# Patient Record
Sex: Male | Born: 1995 | Race: White | Hispanic: Yes | Marital: Single | Smoking: Never smoker
Health system: Southern US, Community
[De-identification: ages and names within clinical notes are randomized; demographics above are authoritative.]

## PROBLEM LIST (undated history)

## (undated) DIAGNOSIS — J45909 Unspecified asthma, uncomplicated: Secondary | ICD-10-CM

## (undated) DIAGNOSIS — K5792 Diverticulitis of intestine, part unspecified, without perforation or abscess without bleeding: Secondary | ICD-10-CM

## (undated) HISTORY — PX: TONSILLECTOMY: SUR1361

---
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
## 2017-05-26 ENCOUNTER — Emergency Department (HOSPITAL_COMMUNITY)
Admission: EM | Admit: 2017-05-26 | Discharge: 2017-05-26 | Disposition: A | Discharge status: Home / Self Care | Payer: Self-pay | Attending: Emergency Medicine | Admitting: Emergency Medicine

## 2017-05-26 ENCOUNTER — Other Ambulatory Visit: Payer: Self-pay

## 2017-05-26 ENCOUNTER — Emergency Department (HOSPITAL_COMMUNITY): Payer: Self-pay

## 2017-05-26 ENCOUNTER — Encounter (HOSPITAL_COMMUNITY): Payer: Self-pay

## 2017-05-26 DIAGNOSIS — R1032 Left lower quadrant pain: Secondary | ICD-10-CM | POA: Insufficient documentation

## 2017-05-26 LAB — URINALYSIS, ROUTINE W REFLEX MICROSCOPIC
GLUCOSE, UA: NEGATIVE mg/dL
HGB URINE DIPSTICK: NEGATIVE
KETONES UR: 20 mg/dL — AB
Leukocytes, UA: NEGATIVE
NITRITE: NEGATIVE
PH: 5 (ref 5.0–8.0)
PROTEIN: 30 mg/dL — AB
RBC / HPF: NONE SEEN RBC/hpf (ref 0–5)
Specific Gravity, Urine: 1.035 — ABNORMAL HIGH (ref 1.005–1.030)

## 2017-05-26 LAB — COMPREHENSIVE METABOLIC PANEL
ALK PHOS: 53 U/L (ref 38–126)
ALT: 23 U/L (ref 17–63)
ANION GAP: 9 (ref 5–15)
AST: 18 U/L (ref 15–41)
Albumin: 3.9 g/dL (ref 3.5–5.0)
BILIRUBIN TOTAL: 1.8 mg/dL — AB (ref 0.3–1.2)
BUN: 7 mg/dL (ref 6–20)
CALCIUM: 9 mg/dL (ref 8.9–10.3)
CO2: 22 mmol/L (ref 22–32)
Chloride: 105 mmol/L (ref 101–111)
Creatinine, Ser: 0.61 mg/dL (ref 0.61–1.24)
GFR calc Af Amer: >60 mL/min (ref >60)
Glucose, Bld: 98 mg/dL (ref 65–99)
Potassium: 3.5 mmol/L (ref 3.5–5.1)
SODIUM: 136 mmol/L (ref 135–145)
TOTAL PROTEIN: 7.8 g/dL (ref 6.5–8.1)

## 2017-05-26 LAB — CBC
HCT: 49.2 % (ref 39.0–52.0)
HEMOGLOBIN: 17.1 g/dL — AB (ref 13.0–17.0)
MCH: 30.5 pg (ref 26.0–34.0)
MCHC: 34.8 g/dL (ref 30.0–36.0)
MCV: 87.9 fL (ref 78.0–100.0)
PLATELETS: 296 10*3/uL (ref 150–400)
RBC: 5.6 MIL/uL (ref 4.22–5.81)
RDW: 12.5 % (ref 11.5–15.5)
WBC: 14.6 10*3/uL — AB (ref 4.0–10.5)

## 2017-05-26 LAB — LIPASE, BLOOD: Lipase: 18 U/L (ref 11–51)

## 2017-05-26 MED ORDER — IOPAMIDOL (ISOVUE-300) INJECTION 61%
INTRAVENOUS | Status: AC
  Filled 2017-05-26: qty 100

## 2017-05-26 MED ORDER — SODIUM CHLORIDE 0.9 % IV BOLUS (SEPSIS): 1000.0000 mL | Freq: Once | INTRAVENOUS | Status: DC

## 2017-05-26 NOTE — Discharge Instructions (Signed)
Please read the attached information regarding your condition. Follow-up with your primary care provider for further evaluation. Return to ED for severe worsening abdominal pain, increased vomiting, lightheadedness, loss of consciousness.

## 2017-05-26 NOTE — ED Provider Notes (Signed)
[facility]-EMERGENCY DEPT Provider Note   CSN: [id number] Arrival date & time: 05/26/17  1338     History   Chief Complaint Chief Complaint  Patient presents with  . Abdominal Pain    HPI ARISTON GRANDISON is a 21 y.o. male  with no significant past medical history, who presents to ED for evaluation of left flank and left lower quadrant abdominal pain that has been intermittent for the past week.  He states that the pain is sharp, rated at 8/10 at its worst and 1/10 otherwise.  Pain is worse with movement and improved with bowel movement.  He has tried ibuprofen with minimal relief in his symptoms.  He also reports 2 episodes of vomiting this past week.  Denies any previous history of similar symptoms.  He denies any dysuria, hematuria, constipation, diarrhea, changes in appetite, penile discharge, history of kidney stones, prior abdominal surgeries, fever, recent travel. He denies any alcohol or tobacco use. Does report occasional marijuana use.  HPI  History reviewed. No pertinent past medical history.  There are no active problems to display for this patient.   Past Surgical History:  Procedure Laterality Date  . TONSILLECTOMY         Home Medications    Prior to Admission medications   Not on File    Family History History reviewed. No pertinent family history.  Social History Social History   Tobacco Use  . Smoking status: Never Smoker  . Smokeless tobacco: Never Used  Substance Use Topics  . Alcohol use: No    Frequency: Never  . Drug use: Yes    Frequency: 7.0 times per week    Types: Marijuana     Allergies   Patient has no allergy information on record.   Review of Systems Review of Systems  Constitutional: Negative for appetite change, chills and fever.  HENT: Negative for ear pain, rhinorrhea, sneezing and sore throat.   Eyes: Negative for photophobia and visual disturbance.  Respiratory: Negative for cough, chest  tightness, shortness of breath and wheezing.   Cardiovascular: Negative for chest pain and palpitations.  Gastrointestinal: Positive for abdominal pain, nausea and vomiting. Negative for blood in stool, constipation and diarrhea.  Genitourinary: Positive for flank pain. Negative for dysuria, hematuria and urgency.  Musculoskeletal: Negative for myalgias.  Skin: Negative for rash.  Neurological: Negative for dizziness, weakness and light-headedness.     Physical Exam Updated Vital Signs BP (!) 141/94 (BP Location: Left Arm)   Pulse 95   Temp 98.4 F (36.9 C) (Oral)   Resp 16   Ht 5\' 8"  (1.727 m)   Wt 120.2 kg (265 lb)   SpO2 98%   BMI 40.29 kg/m   Physical Exam  Constitutional: He appears well-developed and well-nourished. No distress.  Nontoxic appearing obese male.  Does appear uncomfortable with movement and palpation of the abdomen.   HENT:  Head: Normocephalic and atraumatic.  Nose: Nose normal.  Eyes: Conjunctivae and EOM are normal. Left eye exhibits no discharge. No scleral icterus.  Neck: Normal range of motion. Neck supple.  Cardiovascular: Normal rate, regular rhythm, normal heart sounds and intact distal pulses. Exam reveals no gallop and no friction rub.  No murmur heard. Pulmonary/Chest: Effort normal and breath sounds normal. No respiratory distress.  Abdominal: Soft. Bowel sounds are normal. He exhibits no distension. There is tenderness. There is no guarding.  Left flank pain and left lower quadrant pain.  No CVA tenderness noted bilaterally.  Musculoskeletal:  Normal range of motion. He exhibits no edema.  Neurological: He is alert. He exhibits normal muscle tone. Coordination normal.  Skin: Skin is warm and dry. No rash noted.  Psychiatric: He has a normal mood and affect.  Nursing note and vitals reviewed.    ED Treatments / Results  Labs (all labs ordered are listed, but only abnormal results are displayed) Labs Reviewed  COMPREHENSIVE METABOLIC  PANEL - Abnormal; Notable for the following components:      Result Value   Total Bilirubin 1.8 (*)    All other components within normal limits  CBC - Abnormal; Notable for the following components:   WBC 14.6 (*)    Hemoglobin 17.1 (*)    All other components within normal limits  URINALYSIS, ROUTINE W REFLEX MICROSCOPIC - Abnormal; Notable for the following components:   Color, Urine AMBER (*)    Specific Gravity, Urine 1.035 (*)    Bilirubin Urine SMALL (*)    Ketones, ur 20 (*)    Protein, ur 30 (*)    Bacteria, UA FEW (*)    Squamous Epithelial / LPF 0-5 (*)    All other components within normal limits  LIPASE, BLOOD    EKG  EKG Interpretation None       Radiology No results found.  Procedures Procedures (including critical care time)  Medications Ordered in ED Medications  sodium chloride 0.9 % bolus 1,000 mL (not administered)     Initial Impression / Assessment and Plan / ED Course  I have reviewed the triage vital signs and the nursing notes.  Pertinent labs & imaging results that were available during my care of the patient were reviewed by me and considered in my medical decision making (see chart for details).     Patient presents to ED for evaluation of abdominal pain and left-sided flank pain for the past week.  He also reports 2 episodes of vomiting.  He denies any other symptoms including diarrhea, constipation, blood in stool, blood in vomit, sick contacts, urinary symptoms, or history of kidney stones.  On physical exam he was tender to palpation in the left lower quadrant of the left flank but no CVA tenderness was noted.  He is afebrile with no history of fever.  Lab work is unremarkable with the exception of mild dehydration seen on urinalysis as well as leukocytosis at 14.6.  Due to patient's tenderness to palpation and his leukocytosis I did feel that it was best to evaluate for intra-abdominal pathology with a CT scan.  There are no signs of  kidney stones in urine.  However, before receiving fluids and pain medication patient's mother stated that they would like to leave.  Patient himself also stated that "I feel better now and I want to go home."  However, it appeared that the mother was trying to talk him out of getting the scan and staying.  I again asked the patient he stated that he felt like he wanted to go home so that he could eat and he did not want to have an IV in him.  Has full decision making capacity and he states again that he does not want to stay for the CT scan or IV fluids or medication.  Mother states that she will follow-up with his primary care provider and knows to return to the ED for any severe or worsening symptoms.  Final Clinical Impressions(s) / ED Diagnoses   Final diagnoses:  Left lower quadrant pain  ED Discharge Orders    None       Dietrich PatesKhatri, Illyanna Petillo, PA-C 05/26/17 1718    Mancel BaleWentz, Elliott, MD 05/27/17 [phone]

## 2017-05-26 NOTE — ED Triage Notes (Addendum)
Pt arrives today c/o increasing lower left sided abd pain x1 week.. Worse with movement and with oral intake.  LBM 11/24. Able to pass gas. Deneis N/VD. A/Ox4, ambulatory.

## 2017-05-26 NOTE — ED Notes (Signed)
Pt and mother both refusing IV and CT scan stating that "it will take too long." PA Hina notified

## 2017-12-24 ENCOUNTER — Other Ambulatory Visit: Payer: Self-pay

## 2017-12-24 ENCOUNTER — Encounter (HOSPITAL_COMMUNITY): Payer: Self-pay

## 2017-12-24 ENCOUNTER — Emergency Department (HOSPITAL_COMMUNITY)
Admission: EM | Admit: 2017-12-24 | Discharge: 2017-12-24 | Disposition: A | Discharge status: Home / Self Care | Payer: Self-pay | Attending: Emergency Medicine | Admitting: Emergency Medicine

## 2017-12-24 ENCOUNTER — Emergency Department (HOSPITAL_COMMUNITY): Payer: Self-pay

## 2017-12-24 DIAGNOSIS — K529 Noninfective gastroenteritis and colitis, unspecified: Secondary | ICD-10-CM | POA: Insufficient documentation

## 2017-12-24 DIAGNOSIS — Z79899 Other long term (current) drug therapy: Secondary | ICD-10-CM | POA: Insufficient documentation

## 2017-12-24 DIAGNOSIS — J45909 Unspecified asthma, uncomplicated: Secondary | ICD-10-CM | POA: Insufficient documentation

## 2017-12-24 HISTORY — DX: Unspecified asthma, uncomplicated: J45.909

## 2017-12-24 LAB — CBC
HCT: 48.7 % (ref 39.0–52.0)
HEMOGLOBIN: 16.1 g/dL (ref 13.0–17.0)
MCH: 29.7 pg (ref 26.0–34.0)
MCHC: 33.1 g/dL (ref 30.0–36.0)
MCV: 89.7 fL (ref 78.0–100.0)
Platelets: 261 10*3/uL (ref 150–400)
RBC: 5.43 MIL/uL (ref 4.22–5.81)
RDW: 12.5 % (ref 11.5–15.5)
WBC: 12.8 10*3/uL — ABNORMAL HIGH (ref 4.0–10.5)

## 2017-12-24 LAB — URINALYSIS, ROUTINE W REFLEX MICROSCOPIC
Bilirubin Urine: NEGATIVE
GLUCOSE, UA: NEGATIVE mg/dL
Hgb urine dipstick: NEGATIVE
Ketones, ur: NEGATIVE mg/dL
Leukocytes, UA: NEGATIVE
Nitrite: NEGATIVE
PH: 6 (ref 5.0–8.0)
Protein, ur: NEGATIVE mg/dL
SPECIFIC GRAVITY, URINE: 1.026 (ref 1.005–1.030)

## 2017-12-24 LAB — COMPREHENSIVE METABOLIC PANEL
ALT: 30 U/L (ref 17–63)
ANION GAP: 12 (ref 5–15)
AST: 21 U/L (ref 15–41)
Albumin: 3.9 g/dL (ref 3.5–5.0)
Alkaline Phosphatase: 50 U/L (ref 38–126)
BUN: 10 mg/dL (ref 6–20)
CHLORIDE: 106 mmol/L (ref 101–111)
CO2: 24 mmol/L (ref 22–32)
CREATININE: 0.67 mg/dL (ref 0.61–1.24)
Calcium: 9.4 mg/dL (ref 8.9–10.3)
GFR calc non Af Amer: >60 mL/min (ref >60)
Glucose, Bld: 106 mg/dL — ABNORMAL HIGH (ref 65–99)
Potassium: 3.8 mmol/L (ref 3.5–5.1)
SODIUM: 142 mmol/L (ref 135–145)
Total Bilirubin: 0.7 mg/dL (ref 0.3–1.2)
Total Protein: 6.8 g/dL (ref 6.5–8.1)

## 2017-12-24 LAB — LIPASE, BLOOD: LIPASE: 27 U/L (ref 11–51)

## 2017-12-24 MED ORDER — MORPHINE SULFATE (PF) 4 MG/ML IV SOLN
4.0000 mg | Freq: Once | INTRAVENOUS | Status: AC
  Administered 2017-12-24: 4 mg via INTRAVENOUS
  Filled 2017-12-24: qty 1

## 2017-12-24 MED ORDER — SODIUM CHLORIDE 0.9 % IV BOLUS
1000.0000 mL | Freq: Once | INTRAVENOUS | Status: AC
  Administered 2017-12-24: 1000 mL via INTRAVENOUS

## 2017-12-24 MED ORDER — HYDROCODONE-ACETAMINOPHEN 5-325 MG PO TABS: 1.0000 | ORAL_TABLET | Freq: Four times a day (QID) | ORAL | 0 refills | Status: DC | PRN

## 2017-12-24 MED ORDER — METRONIDAZOLE IN NACL 5-0.79 MG/ML-% IV SOLN
500.0000 mg | Freq: Once | INTRAVENOUS | Status: AC
  Administered 2017-12-24: 500 mg via INTRAVENOUS
  Filled 2017-12-24: qty 100

## 2017-12-24 MED ORDER — IOHEXOL 300 MG/ML  SOLN
100.0000 mL | Freq: Once | INTRAMUSCULAR | Status: AC
  Administered 2017-12-24: 100 mL via INTRAVENOUS

## 2017-12-24 MED ORDER — METRONIDAZOLE 500 MG PO TABS: 500.0000 mg | ORAL_TABLET | Freq: Two times a day (BID) | ORAL | 0 refills | Status: DC

## 2017-12-24 MED ORDER — CIPROFLOXACIN HCL 500 MG PO TABS: 500.0000 mg | ORAL_TABLET | Freq: Two times a day (BID) | ORAL | 0 refills | Status: DC

## 2017-12-24 MED ORDER — CIPROFLOXACIN IN D5W 400 MG/200ML IV SOLN
400.0000 mg | Freq: Once | INTRAVENOUS | Status: AC
  Administered 2017-12-24: 400 mg via INTRAVENOUS
  Filled 2017-12-24: qty 200

## 2017-12-24 NOTE — ED Notes (Signed)
Pt stats he understands instructions and home stable with steady gait.with Mother/

## 2017-12-24 NOTE — ED Notes (Signed)
Pt aware of the need of a urine sample. Urinal at bedside. 

## 2017-12-24 NOTE — ED Triage Notes (Signed)
Pt reports having LLQ abd pain for the past week with nausea and constipation, last BM a week ago.

## 2017-12-24 NOTE — ED Provider Notes (Signed)
[facility] EMERGENCY DEPARTMENT Provider Note   CSN: [id number] Arrival date & time: 12/24/17  4098     History   Chief Complaint Chief Complaint  Patient presents with  . Abdominal Pain    HPI Eugene Wolfe is a 22 y.o. male.  HPI Patient presents with left lower quadrant and left flank pain for the past 6 days.  States he has had nausea, subjective fevers, chills, diaphoresis and body aches.  Pain is worse with certain movements.  He has had small bowel movements but no regular bowel movement for the past week.  No blood in the stool.  Endorses urinary frequency but denies dysuria, hematuria, urgency, penile or testicular pain.  Had similar episode of abdominal pain last year which were resolved spontaneously. Past Medical History:  Diagnosis Date  . Asthma     There are no active problems to display for this patient.   Past Surgical History:  Procedure Laterality Date  . TONSILLECTOMY          Home Medications    Prior to Admission medications   Medication Sig Start Date End Date Taking? Authorizing Provider  cetirizine (ZYRTEC) 10 MG tablet Take 10 mg by mouth daily as needed for allergies.   Yes [provider]  ciprofloxacin (CIPRO) 500 MG tablet Take 1 tablet (500 mg total) by mouth 2 (two) times daily. One po bid x 7 days 12/24/17   Loren Racer, MD  HYDROcodone-acetaminophen [facility]) 5-325 MG tablet Take 1 tablet by mouth every 6 (six) hours as needed for severe pain. 12/24/17   Loren Racer, MD  metroNIDAZOLE (FLAGYL) 500 MG tablet Take 1 tablet (500 mg total) by mouth 2 (two) times daily. One po bid x 7 days 12/24/17   Loren Racer, MD    Family History No family history on file.  Social History Social History   Tobacco Use  . Smoking status: Never Smoker  . Smokeless tobacco: Never Used  Substance Use Topics  . Alcohol use: No    Frequency: Never  . Drug use: Yes    Frequency: 7.0 times per week    Types:  Marijuana     Allergies   Patient has no known allergies.   Review of Systems Review of Systems  Constitutional: Positive for appetite change, chills, diaphoresis, fatigue and fever.  Respiratory: Negative for shortness of breath.   Cardiovascular: Negative for chest pain.  Gastrointestinal: Positive for abdominal pain, constipation and nausea. Negative for abdominal distention, blood in stool, diarrhea and vomiting.  Genitourinary: Positive for flank pain and frequency. Negative for dysuria, hematuria and testicular pain.  Musculoskeletal: Positive for back pain and myalgias. Negative for neck pain and neck stiffness.  Skin: Negative for rash and wound.  Neurological: Negative for weakness, light-headedness, numbness and headaches.  All other systems reviewed and are negative.    Physical Exam Updated Vital Signs BP 138/81   Pulse 64   Temp 98.7 F (37.1 C) (Oral)   Resp 17   Ht 5\' 9"  (1.753 m)   Wt 117.9 kg (260 lb)   SpO2 100%   BMI 38.40 kg/m   Physical Exam  Constitutional: He is oriented to person, place, and time. He appears well-developed and well-nourished. No distress.  HENT:  Head: Normocephalic and atraumatic.  Mouth/Throat: Oropharynx is clear and moist.  Eyes: Pupils are equal, round, and reactive to light. EOM are normal.  Neck: Normal range of motion. Neck supple.  Cardiovascular: Normal rate and regular  rhythm. Exam reveals no gallop and no friction rub.  No murmur heard. Pulmonary/Chest: Effort normal and breath sounds normal. No stridor. No respiratory distress. He has no wheezes. He has no rales. He exhibits no tenderness.  Abdominal: Soft. Bowel sounds are normal. There is tenderness. There is no rebound and no guarding.  Left lower quadrant tenderness without rebound or guarding.  Musculoskeletal: Normal range of motion. He exhibits no edema or tenderness.  No CVA tenderness bilaterally.  No midline thoracic or lumbar tenderness.  No lower  extremity swelling, asymmetry or tenderness.  Lymphadenopathy:    He has no cervical adenopathy.  Neurological: He is alert and oriented to person, place, and time.  Moves all extremities without focal deficit.  Sensation fully intact.  Skin: Skin is warm and dry. Capillary refill takes less than 2 seconds. No rash noted. He is not diaphoretic. No erythema.  Psychiatric: He has a normal mood and affect. His behavior is normal.  Nursing note and vitals reviewed.    ED Treatments / Results  Labs (all labs ordered are listed, but only abnormal results are displayed) Labs Reviewed  COMPREHENSIVE METABOLIC PANEL - Abnormal; Notable for the following components:      Result Value   Glucose, Bld 106 (*)    All other components within normal limits  CBC - Abnormal; Notable for the following components:   WBC 12.8 (*)    All other components within normal limits  LIPASE, BLOOD  URINALYSIS, ROUTINE W REFLEX MICROSCOPIC    EKG None  Radiology Ct Abdomen Pelvis W Contrast  Result Date: 12/24/2017 CLINICAL DATA:  Left lower quadrant pain with nausea and constipation. Last bowel movement 1 week ago. EXAM: CT ABDOMEN AND PELVIS WITH CONTRAST TECHNIQUE: Multidetector CT imaging of the abdomen and pelvis was performed using the standard protocol following bolus administration of intravenous contrast. CONTRAST:  100mL OMNIPAQUE IOHEXOL 300 MG/ML  SOLN COMPARISON:  None. FINDINGS: Lower chest: No acute abnormality. Hepatobiliary: No focal liver abnormality is seen. No gallstones, gallbladder wall thickening, or biliary dilatation. Pancreas: Unremarkable. No pancreatic ductal dilatation or surrounding inflammatory changes. Spleen: Normal in size without focal abnormality. Adrenals/Urinary Tract: Adrenal glands are normal. The patient has a horseshoe kidney. No ureteral stones or obstruction. The bladder is poorly distended but unremarkable. Stomach/Bowel: The stomach and small bowel are normal. There is  colonic wall thickening involving the descending colon and a portion of the sigmoid colon without pneumatosis. A few scattered colonic diverticuli are noted but the wall thickening is not associated with a particular diverticulum. The more proximal colon is normal in appearance other than fecal loading. The appendix is unremarkable. Vascular/Lymphatic: The abdominal aorta and branching vessels are normal. A few shotty nodes in the retroperitoneum and the right lower quadrant on series 3, image 61 are likely reactive. Reproductive: Prostate is unremarkable. Other: A small amount of free fluid is seen in the pericolic gutters. Musculoskeletal: No acute or significant osseous findings. IMPRESSION: 1. Colonic wall thickening with adjacent fat stranding involving the descending and sigmoid colon, consistent with colitis which could be infectious, inflammatory, or less likely ischemic. The scattered colonic diverticuli do not appear to be the cause of the wall thickening. 2. Shotty nodes in the abdomen are likely reactive. 3. A small amount of free fluid in the abdomen is likely reactive. Electronically Signed   By: Gerome Samavid  Williams III M.D   On: 12/24/2017 09:58    Procedures Procedures (including critical care time)  Medications Ordered in ED  Medications  morphine 4 MG/ML injection 4 mg (4 mg Intravenous Given 12/24/17 0832)  sodium chloride 0.9 % bolus 1,000 mL (0 mLs Intravenous Stopped 12/24/17 0920)  iohexol (OMNIPAQUE) 300 MG/ML solution 100 mL (100 mLs Intravenous Contrast Given 12/24/17 0916)  ciprofloxacin (CIPRO) IVPB 400 mg (0 mg Intravenous Stopped 12/24/17 1208)  metroNIDAZOLE (FLAGYL) IVPB 500 mg (500 mg Intravenous New Bag/Given 12/24/17 1256)  morphine 4 MG/ML injection 4 mg (4 mg Intravenous Given 12/24/17 1104)     Initial Impression / Assessment and Plan / ED Course  I have reviewed the triage vital signs and the nursing notes.  Pertinent labs & imaging results that were available during  my care of the patient were reviewed by me and considered in my medical decision making (see chart for details).     Patient with colitis on CT.  Likely recurrent from previous visit.  Will treat as infectious but has advised follow-up with gastroenterology.  Understands need to return immediately for any worsening of his symptoms.  Given dose of IV Cipro and Flagyl in the emergency department and pain is been controlled.  Final Clinical Impressions(s) / ED Diagnoses   Final diagnoses:  Colitis    ED Discharge Orders        Ordered    ciprofloxacin (CIPRO) 500 MG tablet  2 times daily     12/24/17 1506    metroNIDAZOLE (FLAGYL) 500 MG tablet  2 times daily     12/24/17 1506    HYDROcodone-acetaminophen (NORCO) 5-325 MG tablet  Every 6 hours PRN     12/24/17 1506       Loren Racer, MD 12/24/17 1507

## 2017-12-24 NOTE — ED Notes (Signed)
Pt ambulated to restroom. Steady gait no assistance needed.

## 2017-12-24 NOTE — ED Notes (Signed)
Pt states he has just walked back from bathroom where he was able to peea dn have bowel movement withput pain.

## 2019-04-07 ENCOUNTER — Emergency Department (HOSPITAL_COMMUNITY): Payer: Self-pay

## 2019-04-07 ENCOUNTER — Emergency Department (HOSPITAL_COMMUNITY)
Admission: EM | Admit: 2019-04-07 | Discharge: 2019-04-07 | Disposition: A | Discharge status: Home / Self Care | Payer: Self-pay | Attending: Emergency Medicine | Admitting: Emergency Medicine

## 2019-04-07 ENCOUNTER — Encounter (HOSPITAL_COMMUNITY): Payer: Self-pay | Admitting: *Deleted

## 2019-04-07 ENCOUNTER — Other Ambulatory Visit: Payer: Self-pay

## 2019-04-07 DIAGNOSIS — K5792 Diverticulitis of intestine, part unspecified, without perforation or abscess without bleeding: Secondary | ICD-10-CM | POA: Insufficient documentation

## 2019-04-07 DIAGNOSIS — R1032 Left lower quadrant pain: Secondary | ICD-10-CM

## 2019-04-07 DIAGNOSIS — J45909 Unspecified asthma, uncomplicated: Secondary | ICD-10-CM | POA: Insufficient documentation

## 2019-04-07 LAB — URINALYSIS, ROUTINE W REFLEX MICROSCOPIC
Bilirubin Urine: NEGATIVE
Glucose, UA: NEGATIVE mg/dL
Hgb urine dipstick: NEGATIVE
Ketones, ur: 80 mg/dL — AB
Leukocytes,Ua: NEGATIVE
Nitrite: NEGATIVE
Protein, ur: NEGATIVE mg/dL
Specific Gravity, Urine: 1.025 (ref 1.005–1.030)
pH: 6 (ref 5.0–8.0)

## 2019-04-07 LAB — CBC
HCT: 52 % (ref 39.0–52.0)
Hemoglobin: 18 g/dL — ABNORMAL HIGH (ref 13.0–17.0)
MCH: 30.2 pg (ref 26.0–34.0)
MCHC: 34.6 g/dL (ref 30.0–36.0)
MCV: 87.1 fL (ref 80.0–100.0)
Platelets: 274 10*3/uL (ref 150–400)
RBC: 5.97 MIL/uL — ABNORMAL HIGH (ref 4.22–5.81)
RDW: 12.6 % (ref 11.5–15.5)
WBC: 12.6 10*3/uL — ABNORMAL HIGH (ref 4.0–10.5)
nRBC: 0 % (ref 0.0–0.2)

## 2019-04-07 LAB — COMPREHENSIVE METABOLIC PANEL
ALT: 16 U/L (ref 0–44)
AST: 19 U/L (ref 15–41)
Albumin: 4.1 g/dL (ref 3.5–5.0)
Alkaline Phosphatase: 52 U/L (ref 38–126)
Anion gap: 12 (ref 5–15)
BUN: 7 mg/dL (ref 6–20)
CO2: 23 mmol/L (ref 22–32)
Calcium: 9.5 mg/dL (ref 8.9–10.3)
Chloride: 106 mmol/L (ref 98–111)
Creatinine, Ser: 0.69 mg/dL (ref 0.61–1.24)
GFR calc Af Amer: >60 mL/min (ref >60)
GFR calc non Af Amer: >60 mL/min (ref >60)
Glucose, Bld: 93 mg/dL (ref 70–99)
Potassium: 4.1 mmol/L (ref 3.5–5.1)
Sodium: 141 mmol/L (ref 135–145)
Total Bilirubin: 1 mg/dL (ref 0.3–1.2)
Total Protein: 8 g/dL (ref 6.5–8.1)

## 2019-04-07 LAB — LIPASE, BLOOD: Lipase: 21 U/L (ref 11–51)

## 2019-04-07 MED ORDER — CIPROFLOXACIN HCL 500 MG PO TABS
500.0000 mg | ORAL_TABLET | Freq: Once | ORAL | Status: AC
  Administered 2019-04-07: 500 mg via ORAL
  Filled 2019-04-07: qty 1

## 2019-04-07 MED ORDER — ONDANSETRON 4 MG PO TBDP
4.0000 mg | ORAL_TABLET | Freq: Once | ORAL | Status: AC
  Administered 2019-04-07: 14:00:00 4 mg via ORAL
  Filled 2019-04-07: qty 1

## 2019-04-07 MED ORDER — SODIUM CHLORIDE 0.9% FLUSH
3.0000 mL | Freq: Once | INTRAVENOUS | Status: AC
  Administered 2019-04-07: 18:00:00 3 mL via INTRAVENOUS

## 2019-04-07 MED ORDER — HYDROCODONE-ACETAMINOPHEN 5-325 MG PO TABS
1.0000 | ORAL_TABLET | Freq: Once | ORAL | Status: AC
  Administered 2019-04-07: 20:00:00 1 via ORAL
  Filled 2019-04-07: qty 1

## 2019-04-07 MED ORDER — IOHEXOL 300 MG/ML  SOLN
100.0000 mL | Freq: Once | INTRAMUSCULAR | Status: AC | PRN
  Administered 2019-04-07: 19:00:00 100 mL via INTRAVENOUS

## 2019-04-07 MED ORDER — MORPHINE SULFATE (PF) 4 MG/ML IV SOLN
4.0000 mg | Freq: Once | INTRAVENOUS | Status: AC
  Administered 2019-04-07: 18:00:00 4 mg via INTRAVENOUS
  Filled 2019-04-07: qty 1

## 2019-04-07 MED ORDER — HYDROCODONE-ACETAMINOPHEN 5-325 MG PO TABS: 2.0000 | ORAL_TABLET | Freq: Four times a day (QID) | ORAL | 0 refills | Status: AC | PRN

## 2019-04-07 MED ORDER — METRONIDAZOLE 500 MG PO TABS
500.0000 mg | ORAL_TABLET | Freq: Once | ORAL | Status: AC
  Administered 2019-04-07: 21:00:00 500 mg via ORAL
  Filled 2019-04-07: qty 1

## 2019-04-07 MED ORDER — CIPROFLOXACIN HCL 500 MG PO TABS: 500.0000 mg | ORAL_TABLET | Freq: Two times a day (BID) | ORAL | 0 refills | Status: AC

## 2019-04-07 MED ORDER — METRONIDAZOLE 500 MG PO TABS: 500.0000 mg | ORAL_TABLET | Freq: Two times a day (BID) | ORAL | 0 refills | Status: AC

## 2019-04-07 NOTE — ED Notes (Signed)
Patient transported to CT 

## 2019-04-07 NOTE — ED Notes (Signed)
Patient verbalizes understanding of discharge instructions. Opportunity for questioning and answers were provided. Armband removed by staff, pt discharged from ED.  

## 2019-04-07 NOTE — Discharge Instructions (Signed)
Please take these medications as prescribed.  Read the attachment on diverticulitis.  Strict return precautions include worsening pain, unrelenting nausea or vomiting, inability to defecate or pass flatulence, or high fevers and or chills.

## 2019-04-07 NOTE — ED Provider Notes (Signed)
[place] EMERGENCY DEPARTMENT Provider Note   CSN: [id number] Arrival date & time: 04/07/19  1403     History   Chief Complaint Chief Complaint  Patient presents with  . Abdominal Pain    HPI KOURTNEY MONTESINOS is a 23 y.o. male.     HPI   23 year old male presents today with complaints of left-sided abdominal pain.  Patient notes over the last 6 days he has had ongoing worsening left upper and lower quadrant abdominal pain.  He notes this is sharp in nature.  He notes originally had 2 days of diarrhea with blood in his stool, notes that he no longer has blood and has been more constipated.  He notes he did have a small bowel movement prior to my evaluation.  He denies any nausea vomiting or fever, denies any right lower quadrant abdominal tenderness, no urinary symptoms.  He denies any abnormal food or drink although he did note eating uncooked cauliflower prior to the symptom onset.    Past Medical History:  Diagnosis Date  . Asthma     There are no active problems to display for this patient.   Past Surgical History:  Procedure Laterality Date  . TONSILLECTOMY          Home Medications    Prior to Admission medications   Medication Sig Start Date End Date Taking? Authorizing Provider  cetirizine (ZYRTEC) 10 MG tablet Take 10 mg by mouth daily as needed for allergies.    [provider]  ciprofloxacin (CIPRO) 500 MG tablet Take 1 tablet (500 mg total) by mouth 2 (two) times daily. One po bid x 7 days 12/24/17   Julianne Rice, MD  HYDROcodone-acetaminophen [facility]) 5-325 MG tablet Take 1 tablet by mouth every 6 (six) hours as needed for severe pain. 12/24/17   Julianne Rice, MD  metroNIDAZOLE (FLAGYL) 500 MG tablet Take 1 tablet (500 mg total) by mouth 2 (two) times daily. One po bid x 7 days 12/24/17   Julianne Rice, MD    Family History No family history on file.  Social History Social History   Tobacco Use  . Smoking status:  Never Smoker  . Smokeless tobacco: Never Used  Substance Use Topics  . Alcohol use: No    Frequency: Never  . Drug use: Yes    Frequency: 7.0 times per week    Types: Marijuana     Allergies   Patient has no known allergies.   Review of Systems Review of Systems  All other systems reviewed and are negative.    Physical Exam Updated Vital Signs BP (!) 146/83 (BP Location: Left Arm)   Pulse 91   Temp 98.4 F (36.9 C) (Oral)   Resp 16   Ht 5\' 9"  (1.753 m)   Wt 120.2 kg   SpO2 97%   BMI 39.13 kg/m   Physical Exam Vitals signs and nursing note reviewed.  Constitutional:      Appearance: He is well-developed.  HENT:     Head: Normocephalic and atraumatic.  Eyes:     General: No scleral icterus.       Right eye: No discharge.        Left eye: No discharge.     Conjunctiva/sclera: Conjunctivae normal.     Pupils: Pupils are equal, round, and reactive to light.  Neck:     Musculoskeletal: Normal range of motion.     Vascular: No JVD.     Trachea: No tracheal deviation.  Pulmonary:     Effort: Pulmonary effort is normal.     Breath sounds: No stridor.  Abdominal:     Comments: Tenderness palpation of left upper and left lower quadrant remainder abdomen soft nontender  Neurological:     Mental Status: He is alert and oriented to person, place, and time.     Coordination: Coordination normal.  Psychiatric:        Behavior: Behavior normal.        Thought Content: Thought content normal.        Judgment: Judgment normal.      ED Treatments / Results  Labs (all labs ordered are listed, but only abnormal results are displayed) Labs Reviewed  CBC - Abnormal; Notable for the following components:      Result Value   WBC 12.6 (*)    RBC 5.97 (*)    Hemoglobin 18.0 (*)    All other components within normal limits  URINALYSIS, ROUTINE W REFLEX MICROSCOPIC - Abnormal; Notable for the following components:   Ketones, ur 80 (*)    All other components within  normal limits  LIPASE, BLOOD  COMPREHENSIVE METABOLIC PANEL    EKG None  Radiology No results found.  Procedures Procedures (including critical care time)  Medications Ordered in ED Medications  sodium chloride flush (NS) 0.9 % injection 3 mL (3 mLs Intravenous Given 04/07/19 1732)  ondansetron (ZOFRAN-ODT) disintegrating tablet 4 mg (4 mg Oral Given 04/07/19 1422)  morphine 4 MG/ML injection 4 mg (4 mg Intravenous Given 04/07/19 1731)  iohexol (OMNIPAQUE) 300 MG/ML solution 100 mL (100 mLs Intravenous Contrast Given 04/07/19 1848)     Initial Impression / Assessment and Plan / ED Course  I have reviewed the triage vital signs and the nursing notes.  Pertinent labs & imaging results that were available during my care of the patient were reviewed by me and considered in my medical decision making (see chart for details).      Assessment/Plan: 22 year old male presents today with left-sided abdominal pain.  This is typical to previous episode of colitis although he does have more abdominal tenderness then I would feel comfortable discharging home at this time.  He does have diverticulosis noted on previous CT scan.  Will obtain CT scan rule out complicating intra-abdominal infection.  Patient care signed to oncoming provider pending CT results and disposition     Final Clinical Impressions(s) / ED Diagnoses   Final diagnoses:  Left lower quadrant abdominal pain    ED Discharge Orders    None       Rosalio Loud 04/07/19 1901    Eber Hong, MD 04/11/19 [id number]

## 2019-04-07 NOTE — ED Triage Notes (Signed)
Pt with LLQ since last Tuesday.  States blood in stool initially, but he has barely eaten anything since then.

## 2019-04-07 NOTE — ED Provider Notes (Addendum)
Physical Exam  BP (!) 146/83 (BP Location: Left Arm)   Pulse 91   Temp 98.4 F (36.9 C) (Oral)   Resp 16   Ht 5\' 9"  (1.753 m)   Wt 120.2 kg   SpO2 97%   BMI 39.13 kg/m      ED Course/Procedures     Ct Abdomen Pelvis W Contrast  Result Date: 04/07/2019 CLINICAL DATA:  Abdominal pain. Diverticulitis suspected. LEFT sided abdominal pain for 6 days. EXAM: CT ABDOMEN AND PELVIS WITH CONTRAST TECHNIQUE: Multidetector CT imaging of the abdomen and pelvis was performed using the standard protocol following bolus administration of intravenous contrast. CONTRAST:  06/07/2019 OMNIPAQUE IOHEXOL 300 MG/ML  SOLN COMPARISON:  CT abdomen dated 12/24/2017. FINDINGS: Lower chest: No acute abnormality. Hepatobiliary: No focal liver abnormality is seen. No gallstones, gallbladder wall thickening, or biliary dilatation. Pancreas: Unremarkable. No pancreatic ductal dilatation or surrounding inflammatory changes. Spleen: Normal in size without focal abnormality. Adrenals/Urinary Tract: Adrenal glands appear normal. Horseshoe shaped kidneys. No renal stone or hydronephrosis. No ureteral or bladder calculi identified. Bladder is decompressed. Stomach/Bowel: Marked edematous thickening of the walls of the upper sigmoid colon, with surrounding pericolonic fluid stranding/inflammation, compatible with acute diverticulitis, with similar findings at same location on CT abdomen of 12/24/2017 indicating a presumed recurrent diverticulitis. No free intraperitoneal air or other confirming signs of a bowel perforation. No dilated large or small bowel loops. Appendix is normal. Stomach is unremarkable, partially decompressed. Vascular/Lymphatic: No vascular abnormality. No significantly enlarged lymph nodes appreciated. Reproductive: Prostate is unremarkable. Other: No abscess collection seen. 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 IMPRESSION: 1. Acute  diverticulitis of the upper sigmoid colon, involving a 7 cm segment of the upper sigmoid colon, with similar findings at same location on CT abdomen of 12/24/2017 indicating a presumed recurrent diverticulitis. No free intraperitoneal air or other confirming signs of a bowel perforation. No abscess collection seen. No evidence of associated bowel obstruction. 2. Horseshoe shaped kidneys. No renal or ureteral calculi. No hydronephrosis. 3. 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 Electronically Signed   By: 12/26/2017 M.D.   On: 04/07/2019 19:16     Procedures  MDM    Received this patient has a handoff from 06/07/2019, PA-C.  He informed me that the patient and I personally performed and agree with his physical exam and assessment.  Patient CT abdomen is reviewed and reveals a acute diverticulitis of the upper sigmoid colon.  There is no intraperitoneal air or evidence of bowel perforation that would make this a complicated diverticulitis.  There is no evidence of abscess or bowel obstruction.  Patient admits to flatulence.    Will consider treating outpatient with ciprofloxacin and Flagyl for the diverticulitis which he has had before with good success.  We will also provide him a short course of Norco for pain.  He can tolerate p.o., he has no significant comorbidities, he is able to obtain these antibiotics prescribed, his pain will be under control, and there is no evidence of an involved abscess, intractable nausea or vomiting, or high WBC or fever.    Discussed these options with the patient and he would prefer to go home.  I feel comfortable discharging him based on CT results, vital signs, and his lab work.  Will provide him with 1 dose of Flagyl and Cipro here in the ED before discharge. Strict return precautions include worsening pain, unrelenting nausea or vomiting, inability to defecate  or pass flatulence, or high fevers and or  chills.     Corena Herter, PA-C 04/07/19 2032    Corena Herter, PA-C 04/07/19 2038    Deno Etienne, DO 04/07/19 2248

## 2019-04-29 ENCOUNTER — Other Ambulatory Visit: Payer: Self-pay

## 2019-04-29 ENCOUNTER — Encounter (HOSPITAL_COMMUNITY): Payer: Self-pay | Admitting: Emergency Medicine

## 2019-04-29 ENCOUNTER — Emergency Department (HOSPITAL_COMMUNITY)
Admission: EM | Admit: 2019-04-29 | Discharge: 2019-04-29 | Disposition: A | Discharge status: Home / Self Care | Payer: Self-pay | Attending: Emergency Medicine | Admitting: Emergency Medicine

## 2019-04-29 DIAGNOSIS — K5792 Diverticulitis of intestine, part unspecified, without perforation or abscess without bleeding: Secondary | ICD-10-CM | POA: Insufficient documentation

## 2019-04-29 DIAGNOSIS — J45909 Unspecified asthma, uncomplicated: Secondary | ICD-10-CM | POA: Insufficient documentation

## 2019-04-29 HISTORY — DX: Diverticulitis of intestine, part unspecified, without perforation or abscess without bleeding: K57.92

## 2019-04-29 LAB — COMPREHENSIVE METABOLIC PANEL
ALT: 16 U/L (ref 0–44)
AST: 15 U/L (ref 15–41)
Albumin: 3.8 g/dL (ref 3.5–5.0)
Alkaline Phosphatase: 40 U/L (ref 38–126)
Anion gap: 13 (ref 5–15)
BUN: 8 mg/dL (ref 6–20)
CO2: 23 mmol/L (ref 22–32)
Calcium: 9.4 mg/dL (ref 8.9–10.3)
Chloride: 103 mmol/L (ref 98–111)
Creatinine, Ser: 0.61 mg/dL (ref 0.61–1.24)
GFR calc Af Amer: >60 mL/min (ref >60)
GFR calc non Af Amer: >60 mL/min (ref >60)
Glucose, Bld: 97 mg/dL (ref 70–99)
Potassium: 3.6 mmol/L (ref 3.5–5.1)
Sodium: 139 mmol/L (ref 135–145)
Total Bilirubin: 1.5 mg/dL — ABNORMAL HIGH (ref 0.3–1.2)
Total Protein: 8 g/dL (ref 6.5–8.1)

## 2019-04-29 LAB — CBC
HCT: 49.6 % (ref 39.0–52.0)
Hemoglobin: 16.6 g/dL (ref 13.0–17.0)
MCH: 29.6 pg (ref 26.0–34.0)
MCHC: 33.5 g/dL (ref 30.0–36.0)
MCV: 88.4 fL (ref 80.0–100.0)
Platelets: 256 10*3/uL (ref 150–400)
RBC: 5.61 MIL/uL (ref 4.22–5.81)
RDW: 12.8 % (ref 11.5–15.5)
WBC: 10.6 10*3/uL — ABNORMAL HIGH (ref 4.0–10.5)
nRBC: 0 % (ref 0.0–0.2)

## 2019-04-29 LAB — URINALYSIS, ROUTINE W REFLEX MICROSCOPIC
Glucose, UA: NEGATIVE mg/dL
Hgb urine dipstick: NEGATIVE
Ketones, ur: >80 mg/dL — AB
Leukocytes,Ua: NEGATIVE
Nitrite: NEGATIVE
Protein, ur: NEGATIVE mg/dL
Specific Gravity, Urine: 1.02 (ref 1.005–1.030)
pH: 6.5 (ref 5.0–8.0)

## 2019-04-29 LAB — LIPASE, BLOOD: Lipase: 18 U/L (ref 11–51)

## 2019-04-29 MED ORDER — SODIUM CHLORIDE 0.9% FLUSH: 3.0000 mL | Freq: Once | INTRAVENOUS | Status: DC

## 2019-04-29 MED ORDER — AMOXICILLIN-POT CLAVULANATE 875-125 MG PO TABS: 1.0000 | ORAL_TABLET | Freq: Two times a day (BID) | ORAL | 0 refills | Status: DC

## 2019-04-29 NOTE — ED Notes (Signed)
Patient verbalizes understanding of discharge instructions. Opportunity for questioning and answers were provided. Armband removed by staff, pt discharged from ED ambulatory to home.  

## 2019-04-29 NOTE — ED Provider Notes (Signed)
[facility] EMERGENCY DEPARTMENT Provider Note   CSN: [id number] Arrival date & time: 04/29/19  0449     History   Chief Complaint Chief Complaint  Patient presents with  . Abdominal Pain    Hx. Diverticulitis    HPI Eugene Wolfe is a 23 y.o. male with a past medical history of diverticulosis presents to ED for left lower quadrant abdominal pain and diarrhea for the past 2 days.  States that the pain is worse when he needs to have a bowel movement and improves when he has a bowel movement.  States that this is typical of his diverticulitis flareups.  States that when he was here 3 weeks ago his symptoms improved with the antibiotics but then returned.  He has not seen a specialist yet about his recurrent diverticulitis due to lack of insurance.  Denies any urinary symptoms, sick contacts similar symptoms, vomiting, fever, shortness of breath, prior abdominal surgeries.     HPI  Past Medical History:  Diagnosis Date  . Asthma   . Diverticulitis     There are no active problems to display for this patient.   Past Surgical History:  Procedure Laterality Date  . TONSILLECTOMY          Home Medications    Prior to Admission medications   Medication Sig Start Date End Date Taking? Authorizing Provider  amoxicillin-clavulanate (AUGMENTIN) 875-125 MG tablet Take 1 tablet by mouth every 12 (twelve) hours. 04/29/19   Kenijah Benningfield, PA-C  cetirizine (ZYRTEC) 10 MG tablet Take 10 mg by mouth daily as needed for allergies.    [provider]    Family History No family history on file.  Social History Social History   Tobacco Use  . Smoking status: Never Smoker  . Smokeless tobacco: Never Used  Substance Use Topics  . Alcohol use: No    Frequency: Never  . Drug use: Yes    Frequency: 7.0 times per week    Types: Marijuana     Allergies   Patient has no known allergies.   Review of Systems Review of Systems  Constitutional:  Negative for appetite change, chills and fever.  HENT: Negative for ear pain, rhinorrhea, sneezing and sore throat.   Eyes: Negative for photophobia and visual disturbance.  Respiratory: Negative for cough, chest tightness, shortness of breath and wheezing.   Cardiovascular: Negative for chest pain and palpitations.  Gastrointestinal: Positive for abdominal pain and diarrhea. Negative for blood in stool, constipation, nausea and vomiting.  Genitourinary: Negative for dysuria, hematuria and urgency.  Musculoskeletal: Negative for myalgias.  Skin: Negative for rash.  Neurological: Negative for dizziness, weakness and light-headedness.     Physical Exam Updated Vital Signs BP (!) 146/80 (BP Location: Right Arm)   Pulse 64   Temp 98.9 F (37.2 C) (Oral)   Resp 18   Ht 5\' 9"  (1.753 m)   Wt 115.7 kg   SpO2 98%   BMI 37.66 kg/m   Physical Exam Vitals signs and nursing note reviewed.  Constitutional:      General: He is not in acute distress.    Appearance: He is well-developed.  HENT:     Head: Normocephalic and atraumatic.     Nose: Nose normal.  Eyes:     General: No scleral icterus.       Left eye: No discharge.     Conjunctiva/sclera: Conjunctivae normal.  Neck:     Musculoskeletal: Normal range of motion and neck supple.  Cardiovascular:     Rate and Rhythm: Normal rate and regular rhythm.     Heart sounds: Normal heart sounds. No murmur. No friction rub. No gallop.   Pulmonary:     Effort: Pulmonary effort is normal. No respiratory distress.     Breath sounds: Normal breath sounds.  Abdominal:     General: Bowel sounds are normal. There is no distension.     Palpations: Abdomen is soft.     Tenderness: There is abdominal tenderness in the left lower quadrant. There is no guarding.  Musculoskeletal: Normal range of motion.  Skin:    General: Skin is warm and dry.     Findings: No rash.  Neurological:     Mental Status: He is alert.     Motor: No abnormal muscle  tone.     Coordination: Coordination normal.      ED Treatments / Results  Labs (all labs ordered are listed, but only abnormal results are displayed) Labs Reviewed  COMPREHENSIVE METABOLIC PANEL - Abnormal; Notable for the following components:      Result Value   Total Bilirubin 1.5 (*)    All other components within normal limits  CBC - Abnormal; Notable for the following components:   WBC 10.6 (*)    All other components within normal limits  URINALYSIS, ROUTINE W REFLEX MICROSCOPIC - Abnormal; Notable for the following components:   Bilirubin Urine SMALL (*)    Ketones, ur >80 (*)    All other components within normal limits  LIPASE, BLOOD    EKG None  Radiology No results found.  Procedures Procedures (including critical care time)  Medications Ordered in ED Medications  sodium chloride flush (NS) 0.9 % injection 3 mL (3 mLs Intravenous Not Given 04/29/19 1218)     Initial Impression / Assessment and Plan / ED Course  I have reviewed the triage vital signs and the nursing notes.  Pertinent labs & imaging results that were available during my care of the patient were reviewed by me and considered in my medical decision making (see chart for details).        23 year old male with past medical history of diverticulosis presents to ED for left lower quadrant abdominal pain and diarrhea.  He states that this feels similar to his prior flareups of diverticulitis.  Improvement with antibiotics in the past.  Denies fever, history of abscess formation, prior abdominal surgeries, shortness of breath.  On exam he is overall well-appearing.  Tenderness palpation of the left lower quadrant without rebound or guarding.  He is afebrile.  No tachycardia or tachypnea noted.  Lab work is reassuring.  He is able to tolerate p.o. intake without difficulty.  Do not see any indication for repeat CT at this time as his last one was 3 weeks ago and he states that this feels similar.   Will treat with p.o. antibiotics and have him follow-up with PCP. Return precautions given.  Patient is hemodynamically stable, in NAD, and able to ambulate in the ED. Evaluation does not show pathology that would require ongoing emergent intervention or inpatient treatment. I explained the diagnosis to the patient. Pain has been managed and has no complaints prior to discharge. Patient is comfortable with above plan and is stable for discharge at this time. All questions were answered prior to disposition. Strict return precautions for returning to the ED were discussed. Encouraged follow up with PCP.   An After Visit Summary was printed and given to the patient.  Portions of this note were generated with [employer]. Dictation errors may occur despite best attempts at proofreading.   Final Clinical Impressions(s) / ED Diagnoses   Final diagnoses:  Diverticulitis    ED Discharge Orders         Ordered    amoxicillin-clavulanate (AUGMENTIN) 875-125 MG tablet  Every 12 hours     04/29/19 1238           Delia Heady, PA-C 04/29/19 1244    Drenda Freeze, MD 04/30/19 [id number]

## 2019-04-29 NOTE — ED Triage Notes (Signed)
Patient reports LLQ pain and low back pain onset Saturday with emesis , no diarrhea or fever .

## 2019-04-29 NOTE — Discharge Instructions (Signed)
Take the antibiotics as directed. Return to the ED if you start to develop a fever, worsening abdominal pain, inability to control vomiting or blood in your stool.

## 2021-03-30 IMAGING — CT CT ABD-PELV W/ CM
2 of 4 series · 16 of 46 positions shown, 18 images · IV contrast (APPLIED)
Comparison: CT abdomen dated 12/24/2017.

CLINICAL DATA: Abdominal pain. Diverticulitis suspected. LEFT sided
abdominal pain for 6 days.

EXAM:
CT ABDOMEN AND PELVIS WITH CONTRAST
TECHNIQUE: Multidetector CT imaging of the abdomen and pelvis was performed
using the standard protocol following bolus administration of
intravenous contrast.
CONTRAST:  100mL OMNIPAQUE IOHEXOL 300 MG/ML  SOLN

[Series 3: abd/ pelvis 5.0 i30f 2 · axial · 0.98mm/px · z∈[+650,+1094]mm · 13 of 99 slices shown, 15 images]
[im 5/99  soft-tissue]
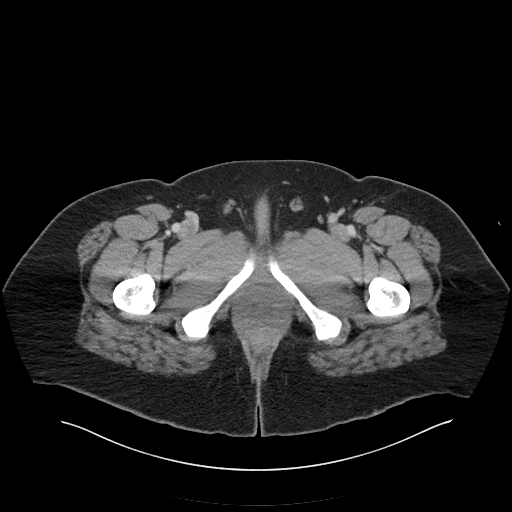
[im 5/99  bone]
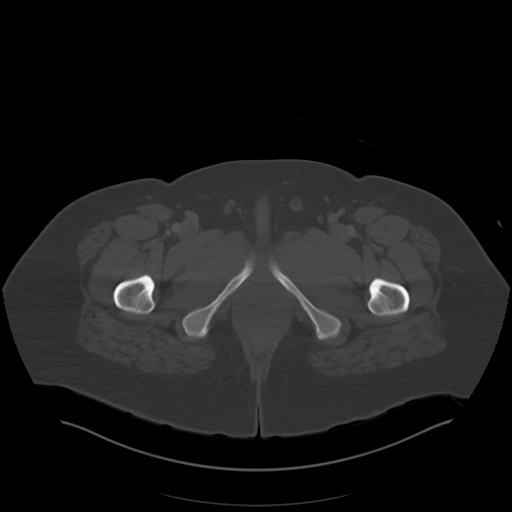
[im 13/99  soft-tissue]
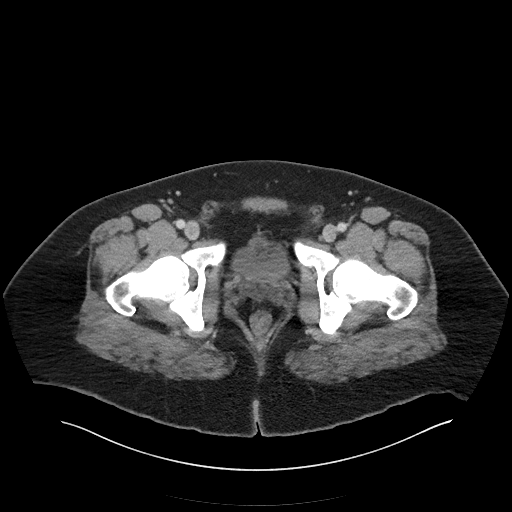
[im 22/99  soft-tissue]
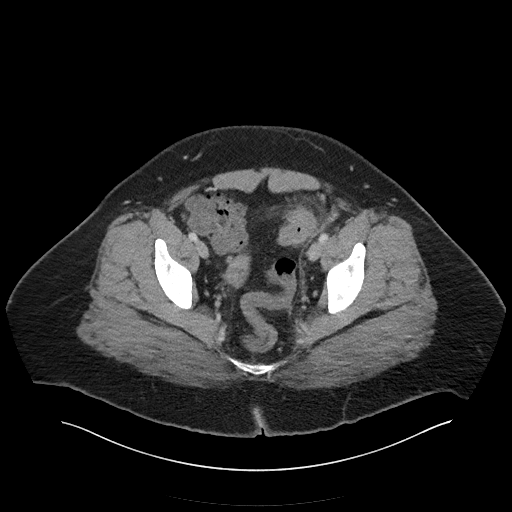
[im 26/99  soft-tissue]
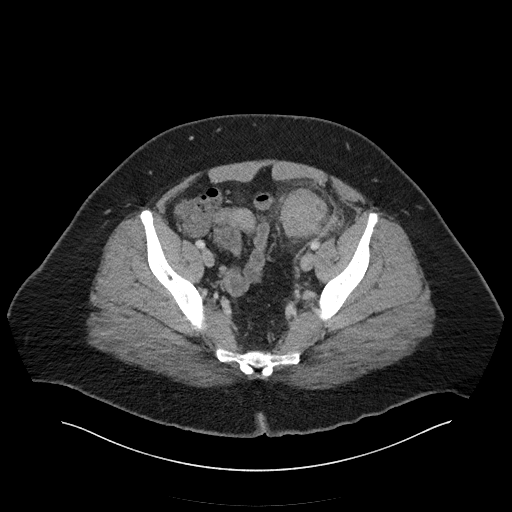
[im 35/99  soft-tissue]
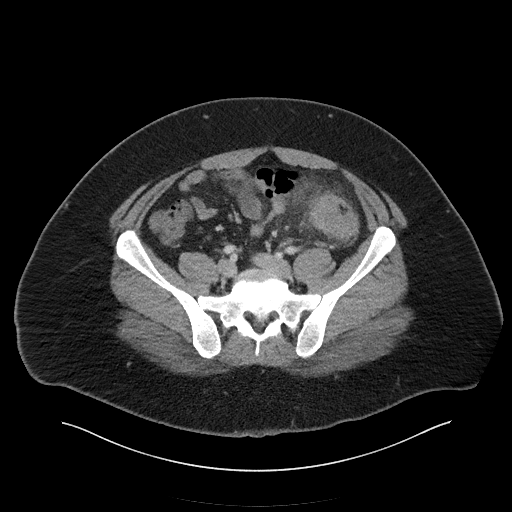
[im 43/99  soft-tissue]
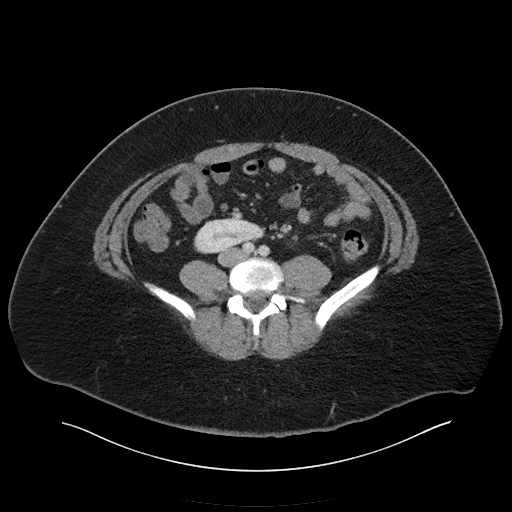
[im 52/99  soft-tissue]
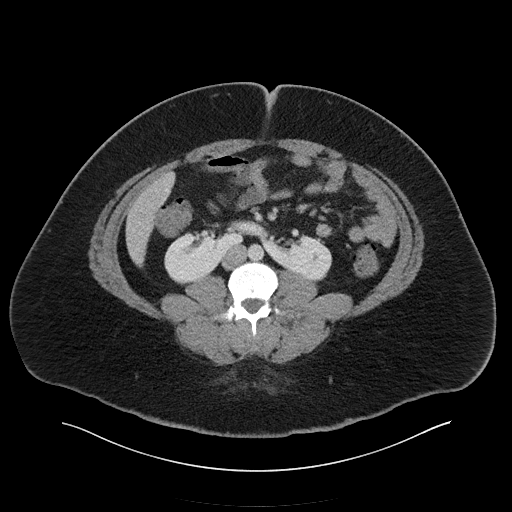
[im 56/99  soft-tissue]
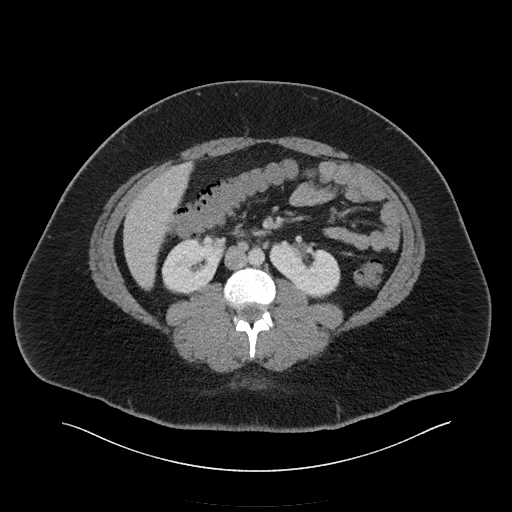
[im 64/99  soft-tissue]
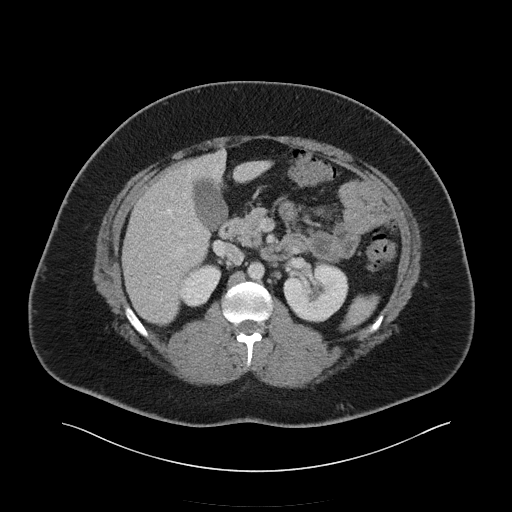
[im 64/99  bone]
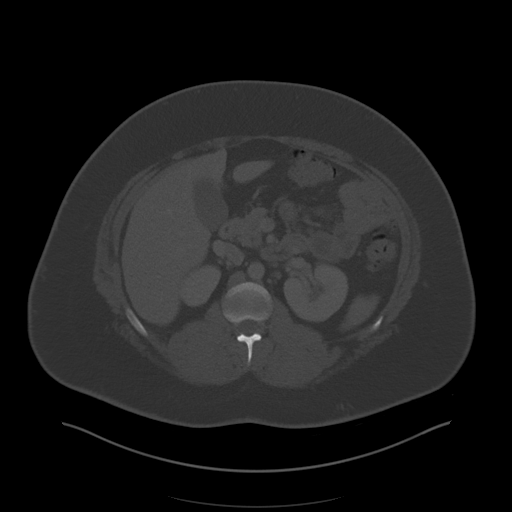
[im 73/99  soft-tissue]
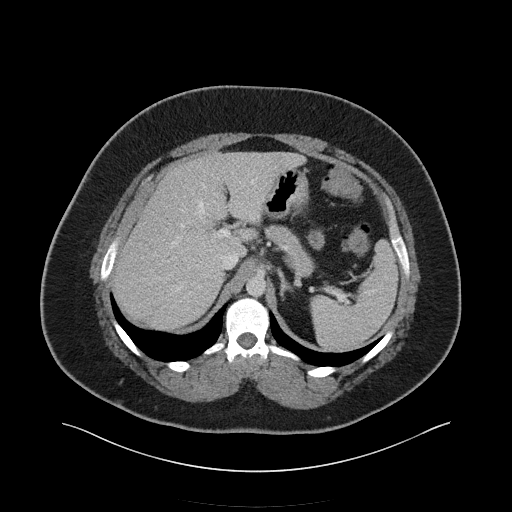
[im 77/99  soft-tissue]
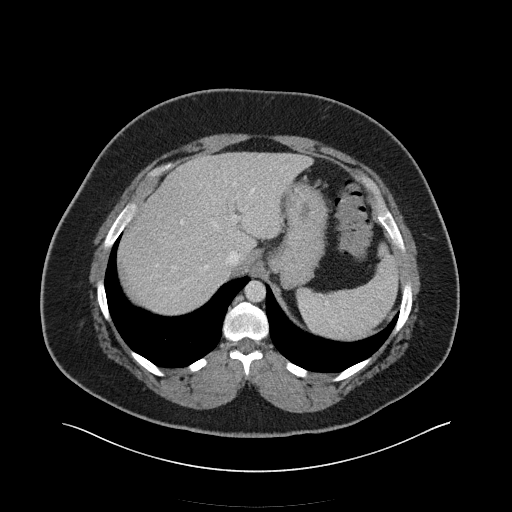
[im 86/99  soft-tissue]
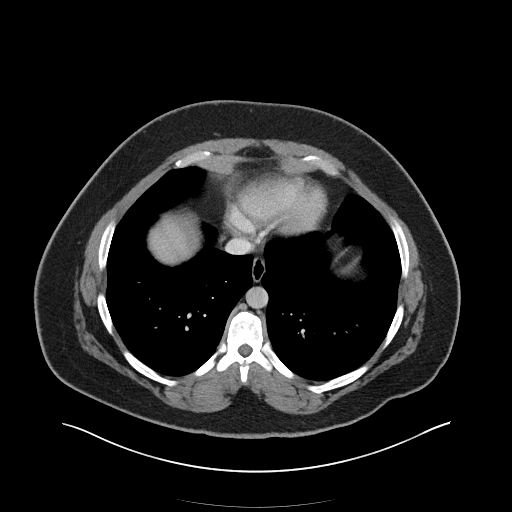
[im 94/99  soft-tissue]
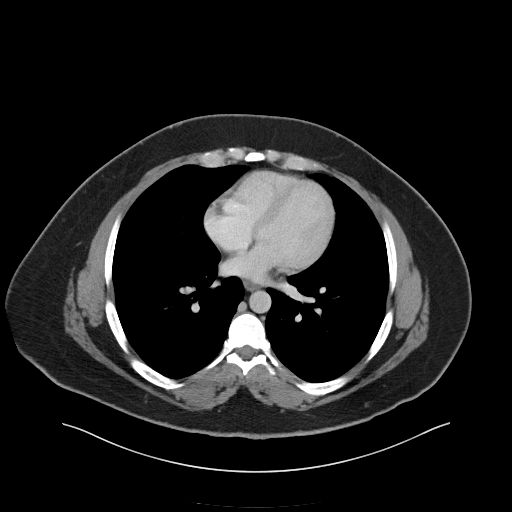

[Series 6: coronal soft tissue · coronal · 0.86mm/px · 3 of 118 slices shown]
[im 40/118  soft-tissue]
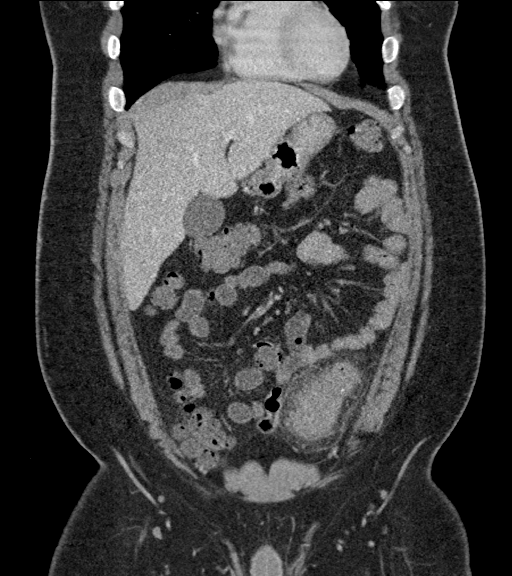
[im 53/118  soft-tissue]
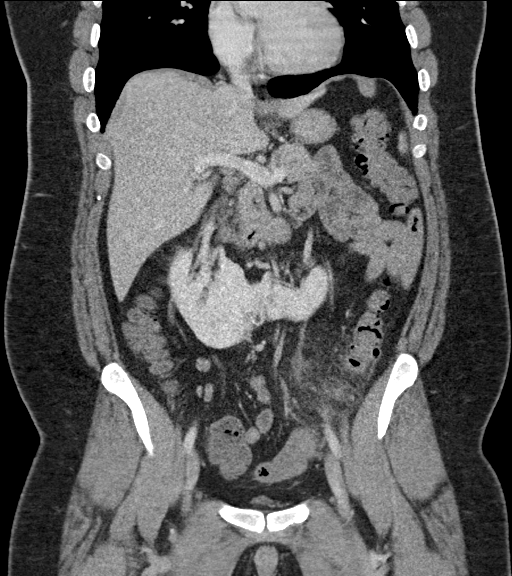
[im 66/118  soft-tissue]
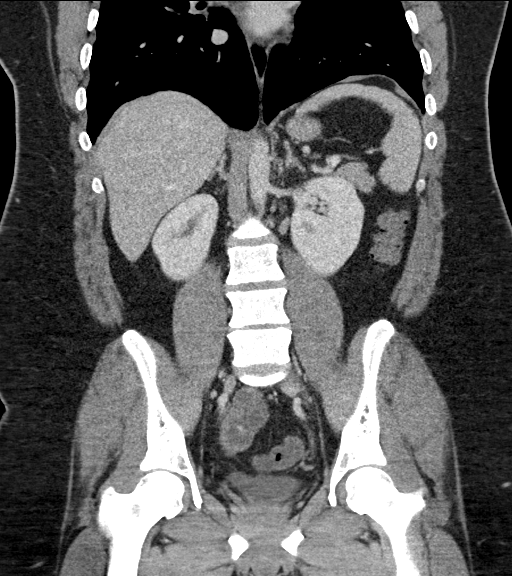

[16 of 46 positions shown; findings below may reference images not displayed]

FINDINGS: Lower chest: No acute abnormality.

Hepatobiliary: No focal liver abnormality is seen. No gallstones,
gallbladder wall thickening, or biliary dilatation.

Pancreas: Unremarkable. No pancreatic ductal dilatation or
surrounding inflammatory changes.

Spleen: Normal in size without focal abnormality.

Adrenals/Urinary Tract: Adrenal glands appear normal. Horseshoe
shaped kidneys. No renal stone or hydronephrosis. No ureteral or
bladder calculi identified. Bladder is decompressed.

Stomach/Bowel: Marked edematous thickening of the walls of the upper
sigmoid colon, with surrounding pericolonic fluid
stranding/inflammation, compatible with acute diverticulitis, with
similar findings at same location on CT abdomen of 12/24/2017
indicating a presumed recurrent diverticulitis.

No free intraperitoneal air or other confirming signs of a bowel
perforation.

No dilated large or small bowel loops. Appendix is normal. Stomach
is unremarkable, partially decompressed.

Vascular/Lymphatic: No vascular abnormality. No significantly
enlarged lymph nodes appreciated.

Reproductive: Prostate is unremarkable.

Other: No abscess collection seen.

Musculoskeletal: Bilateral chronic pars interarticularis defects at
L5-S1 without associated spondylolisthesis.
IMPRESSION: 1. Acute diverticulitis of the upper sigmoid colon, involving a 7 cm
segment of the upper sigmoid colon, with similar findings at same
location on CT abdomen of 12/24/2017 indicating a presumed recurrent
diverticulitis. No free intraperitoneal air or other confirming
signs of a bowel perforation. No abscess collection seen. No
evidence of associated bowel obstruction.
2. Horseshoe shaped kidneys. No renal or ureteral calculi. No
hydronephrosis.
3. Bilateral chronic pars interarticularis defects at L5-S1 without
associated spondylolisthesis.

## 2023-04-27 ENCOUNTER — Encounter: Payer: Self-pay | Admitting: Student

## 2023-04-27 ENCOUNTER — Other Ambulatory Visit (HOSPITAL_COMMUNITY)
Admission: RE | Admit: 2023-04-27 | Discharge: 2023-04-27 | Disposition: A | Discharge status: Home / Self Care | Payer: 59 | Source: Ambulatory Visit | Attending: Family Medicine | Admitting: Family Medicine

## 2023-04-27 ENCOUNTER — Other Ambulatory Visit: Payer: Self-pay

## 2023-04-27 ENCOUNTER — Ambulatory Visit: Payer: 59 | Admitting: Student

## 2023-04-27 VITALS — BP 123/88 | HR 63 | Ht 70.0 in | Wt 221.2 lb

## 2023-04-27 DIAGNOSIS — Z7689 Persons encountering health services in other specified circumstances: Secondary | ICD-10-CM

## 2023-04-27 DIAGNOSIS — R300 Dysuria: Secondary | ICD-10-CM | POA: Insufficient documentation

## 2023-04-27 DIAGNOSIS — T7840XA Allergy, unspecified, initial encounter: Secondary | ICD-10-CM

## 2023-04-27 LAB — POCT URINALYSIS DIP (MANUAL ENTRY)
Bilirubin, UA: NEGATIVE
Blood, UA: NEGATIVE
Glucose, UA: NEGATIVE mg/dL
Ketones, POC UA: NEGATIVE mg/dL
Leukocytes, UA: NEGATIVE
Nitrite, UA: NEGATIVE
Protein Ur, POC: NEGATIVE mg/dL
Spec Grav, UA: >=1.03 — AB (ref 1.010–1.025)
Urobilinogen, UA: 0.2 U/dL
pH, UA: 5.5 (ref 5.0–8.0)

## 2023-04-27 MED ORDER — FLUTICASONE PROPIONATE 50 MCG/ACT NA SUSP: 2.0000 | Freq: Every day | NASAL | 6 refills | Status: AC

## 2023-04-27 MED ORDER — AZELASTINE HCL 0.1 % NA SOLN: 2.0000 | Freq: Two times a day (BID) | NASAL | 12 refills | Status: DC

## 2023-04-27 NOTE — Patient Instructions (Addendum)
Eugene Wolfe to meet you! Tell the family I said hey.  I would like to start 2 nasal sprays for you that should help with both the allergies and the sinus pressure that you are having.  The first is called azelastine and is an intranasal antihistamine.  The second is Flonase which you have used before.   You may continue to take the cetirizine every day. I will send you a MyChart message with the results of the testing we are getting today.  I did not see you for another year unless something comes up. [name], MD

## 2023-04-27 NOTE — Progress Notes (Unsigned)
    SUBJECTIVE:   CHIEF COMPLAINT / HPI:   New Patient  Patient presents today to establish with me as his PCP.  Per chart review, has a history of none.   Medications include daily cetirizine.[facility] specialists for None/  Family: Family history HTN, Diabetes  Hobbies/Activities: Play music. Jams on guitar, piano, drums, sings. Play video games. Management sim games. Graveyard Keeper.  Work: Software engineer at [employer].  Exercise: Not recently. Previously doing calisthenics/body-weight stuff. Trying to get back into it.  Tobacco:  Vapes. Marijuana.  etOH/drugs:  Nope.  Sex: Yes, with baby's mother. 2 months old. Mom also works at [employer].   Urinary urgency.  After emptying has stinging at the meatus and an urge to go again. Uncircumcised.   Allergies Cetirizine. Sneezes are the worst part. Nasal sprays in the past PRN.    Head and Neck Aches Feels like they're related to allergies.  Sinus pain.   PERTINENT  PMH / PSH: ***  OBJECTIVE:   There were no vitals taken for this visit.  ***  ASSESSMENT/PLAN:   No problem-specific Assessment & Plan notes found for this encounter.      [name], MD [facility]

## 2023-04-28 DIAGNOSIS — R300 Dysuria: Secondary | ICD-10-CM | POA: Insufficient documentation

## 2023-04-28 DIAGNOSIS — Z7689 Persons encountering health services in other specified circumstances: Secondary | ICD-10-CM | POA: Insufficient documentation

## 2023-04-28 DIAGNOSIS — T7840XA Allergy, unspecified, initial encounter: Secondary | ICD-10-CM | POA: Insufficient documentation

## 2023-04-28 LAB — RPR: RPR Ser Ql: NONREACTIVE

## 2023-04-28 LAB — HIV ANTIBODY (ROUTINE TESTING W REFLEX): HIV Screen 4th Generation wRfx: NONREACTIVE

## 2023-04-28 NOTE — Assessment & Plan Note (Addendum)
Burning at meatus. Only after urination. Normal exam. Benign UA. - GC/Chlamydia - RPR/HIV, though expect these all to be low-yield given partner's recent testing during pregnancy

## 2023-04-28 NOTE — Assessment & Plan Note (Signed)
-   Start Azelastine and Flonase BID - Continue daily cetirizine

## 2023-04-28 NOTE — Assessment & Plan Note (Addendum)
Defer lipid testing given young age. Encourage regular exercise. BP appropriate today. No tobacco use, but would suggest cessation of vape products due to risk of vaping related lung injury. HIV testing as above.

## 2023-05-02 LAB — URINE CYTOLOGY ANCILLARY ONLY
Chlamydia: NEGATIVE
Comment: NEGATIVE
Comment: NEGATIVE
Comment: NORMAL
Neisseria Gonorrhea: NEGATIVE
Trichomonas: NEGATIVE

## 2023-05-03 ENCOUNTER — Encounter: Payer: Self-pay | Admitting: Student

## 2023-05-03 DIAGNOSIS — R6889 Other general symptoms and signs: Secondary | ICD-10-CM

## 2023-05-03 NOTE — Addendum Note (Signed)
Addended by: Darnelle Spangle B on: 05/03/2023 12:44 PM   Modules accepted: Orders

## 2023-06-11 ENCOUNTER — Ambulatory Visit: Payer: Medicaid Other | Admitting: Student

## 2023-06-13 ENCOUNTER — Ambulatory Visit (INDEPENDENT_AMBULATORY_CARE_PROVIDER_SITE_OTHER): Payer: 59 | Admitting: Student

## 2023-06-13 ENCOUNTER — Encounter: Payer: Self-pay | Admitting: Student

## 2023-06-13 VITALS — BP 118/70 | HR 70 | Ht 69.0 in | Wt 225.0 lb

## 2023-06-13 DIAGNOSIS — K921 Melena: Secondary | ICD-10-CM

## 2023-06-13 DIAGNOSIS — G5603 Carpal tunnel syndrome, bilateral upper limbs: Secondary | ICD-10-CM

## 2023-06-13 NOTE — Patient Instructions (Signed)
Eugene Wolfe to see you!  Let's get you some wrist splints to wear at night. This should help relieve the numbness/pain in your hands.  If it does not help, we can try and injection.   I am sending a referral to [facility] to get you in to discuss a colonoscopy. They are usually a few weeks behind, but should be calling you in the next few weeks.  We'll see you back Monday for iron labs.   [name], MD

## 2023-06-13 NOTE — Progress Notes (Signed)
    SUBJECTIVE:   CHIEF COMPLAINT / HPI:   Numbness and Pain in Fingers Thinks he has carpal tunnel. Numbness/Tingling in thumb, second, and third fingers of the bilateral hands. 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 Never had this before. No acute injuries to either side.   Tenesmus and hematochezia Has a several year history of intermittent flares of hematochezia. First seen in 2019 and had a CT with colonic wall thickening and adjacent fat stranding c/w colitis vs diverticulitis. He then had evidence of Diverticulitis on a subsequent CT in 2020. He has yet to have a colonoscopy. Got connected with Dr. Bosie Clos, but his office is not covered by patient's insurance and the cash-pay price for colonoscopy was going to be prohibitively expensive. Not having any symptoms right now aside from occasional tenesmus and sometimes small volume hematochezia. He has identified triggers for his symptoms: Red 40, [place], and spicy foods.    OBJECTIVE:   BP 118/70   Pulse 70   Ht 5\' 9"  (1.753 m)   Wt 225 lb (102.1 kg)   SpO2 100%   BMI 33.23 kg/m   Gen: Quite well-appearing and NAD HEENT: normocephalic, atraumatic, EOM grossly intact, oral mucosa moist, neck supple Respiratory: normal respiratory effort GI: non-distended, non-tender, no organomegaly MSK: Bilateral hands and wrists without swelling or deformity. There is normal strength and sensation to all fingers. Functional testing of the radial, median, and ulnar nerves is WNL. Negative Phalen's and Tinel testing.    ASSESSMENT/PLAN:   Bilateral carpal tunnel syndrome History is suggestive of bilateral Carpal Tunnel Syndrome. Mild-to-moderate symptoms at worst. Hopeful this will improve with relatively conservative therapies.  - Bilateral cock-up splints for nighttime wear   Hematochezia History and age of onset certainly raise concern for possible IBD, though the suggestion  of diverticulitis on CT imaging is interesting. He will need a colonoscopy. Given Dr. Bosie Clos is not in-network for his insurance ,will refer him to [facility]. He is symptom free for the time being.      [name], MD [facility]

## 2023-06-15 DIAGNOSIS — K921 Melena: Secondary | ICD-10-CM | POA: Insufficient documentation

## 2023-06-15 DIAGNOSIS — G5603 Carpal tunnel syndrome, bilateral upper limbs: Secondary | ICD-10-CM | POA: Insufficient documentation

## 2023-06-15 NOTE — Assessment & Plan Note (Signed)
History is suggestive of bilateral Carpal Tunnel Syndrome. Mild-to-moderate symptoms at worst. Hopeful this will improve with relatively conservative therapies.  - Bilateral cock-up splints for nighttime wear

## 2023-06-15 NOTE — Assessment & Plan Note (Signed)
History and age of onset certainly raise concern for possible IBD, though the suggestion of diverticulitis on CT imaging is interesting. He will need a colonoscopy. Given Dr. Bosie Clos is not in-network for his insurance ,will refer him to [facility]. He is symptom free for the time being.

## 2023-06-18 ENCOUNTER — Other Ambulatory Visit: Payer: Self-pay

## 2023-06-21 ENCOUNTER — Other Ambulatory Visit: Payer: 59

## 2023-06-21 DIAGNOSIS — R6889 Other general symptoms and signs: Secondary | ICD-10-CM

## 2023-06-22 LAB — CBC
Hematocrit: 46.9 % (ref 37.5–51.0)
Hemoglobin: 15.5 g/dL (ref 13.0–17.7)
MCH: 30.4 pg (ref 26.6–33.0)
MCHC: 33 g/dL (ref 31.5–35.7)
MCV: 92 fL (ref 79–97)
Platelets: 225 10*3/uL (ref 150–450)
RBC: 5.1 x10E6/uL (ref 4.14–5.80)
RDW: 12.4 % (ref 11.6–15.4)
WBC: 7.3 10*3/uL (ref 3.4–10.8)

## 2023-06-22 LAB — TSH RFX ON ABNORMAL TO FREE T4: TSH: 1.57 u[IU]/mL (ref 0.450–4.500)

## 2023-06-22 LAB — FERRITIN: Ferritin: 224 ng/mL (ref 30–400)

## 2023-08-16 ENCOUNTER — Encounter: Payer: Self-pay | Admitting: Gastroenterology

## 2023-10-03 ENCOUNTER — Encounter: Payer: Self-pay | Admitting: Gastroenterology

## 2023-10-03 ENCOUNTER — Ambulatory Visit: Payer: 59 | Admitting: Gastroenterology

## 2023-10-03 VITALS — BP 110/80 | HR 71 | Ht 69.0 in | Wt 236.2 lb

## 2023-10-03 DIAGNOSIS — R194 Change in bowel habit: Secondary | ICD-10-CM | POA: Insufficient documentation

## 2023-10-03 DIAGNOSIS — K625 Hemorrhage of anus and rectum: Secondary | ICD-10-CM | POA: Diagnosis not present

## 2023-10-03 DIAGNOSIS — K5900 Constipation, unspecified: Secondary | ICD-10-CM | POA: Insufficient documentation

## 2023-10-03 MED ORDER — NA SULFATE-K SULFATE-MG SULF 17.5-3.13-1.6 GM/177ML PO SOLN: 1.0000 | Freq: Once | ORAL | 0 refills | Status: AC

## 2023-10-03 NOTE — Patient Instructions (Addendum)
 You have been scheduled for a colonoscopy. Please follow written instructions given to you at your visit today.   If you use inhalers (even only as needed), please bring them with you on the day of your procedure.  DO NOT TAKE 7 DAYS PRIOR TO TEST- Trulicity (dulaglutide) Ozempic, Wegovy (semaglutide) Mounjaro (tirzepatide) Bydureon Bcise (exanatide extended release)  DO NOT TAKE 1 DAY PRIOR TO YOUR TEST Rybelsus (semaglutide) Adlyxin (lixisenatide) Victoza (liraglutide) Byetta (exanatide) __________________________________________________________________  If your blood pressure at your visit was 140/90 or greater, please contact your primary care physician to follow up on this.  _______________________________________________________  If you are age 22 or older, your body mass index should be between 23-30. Your Body mass index is 34.88 kg/m. If this is out of the aforementioned range listed, please consider follow up with your Primary Care Provider.  If you are age 90 or younger, your body mass index should be between 19-25. Your Body mass index is 34.88 kg/m. If this is out of the aformentioned range listed, please consider follow up with your Primary Care Provider.   ________________________________________________________  The [facility] providers would like to encourage you to use [facility] to communicate with providers for non-urgent requests or questions.  Due to long hold times on the telephone, sending your provider a message by [facility] may be a faster and more efficient way to get a response.  Please allow 48 business hours for a response.  Please remember that this is for non-urgent requests.  _______________________________________________________

## 2023-10-03 NOTE — Progress Notes (Signed)
 ____________________________________________________________  Attending physician addendum:  Thank you for sending this case to me. I have reviewed the entire note and agree with the plan.   Amada Jupiter, MD  ____________________________________________________________

## 2023-10-03 NOTE — Progress Notes (Signed)
 10/03/2023 Eugene Wolfe [id number] December 30, 1995   HISTORY OF PRESENT ILLNESS: This is a 28 year old male who is new to our office.  He has been referred here by his PCPs office, Dr. Marisue Humble and Dr. Lum Babe, for evaluation of rectal bleeding.  He tells me that has been having issues with his bowels and bleeding for quite some time.  He describes intermittently seeing red blood in his stool, sometimes darker color blood as well.  Overall probably been having issues for about 4 years or more.  He does not have any diarrhea.  He says that his stools are soft, but a lot of times he feels like he cannot go, but then when he does go it is not hard stool.  He describes what sounds like forceful bowel movements that are involuntary and rectal spasm.  No family history of IBD.  He had a CT scan in June 2019 that showed colonic wall thickening with adjacent fat stranding valving the descending and sigmoid colon consistent with colitis, they did not think it was diverticulitis at that time although does have some diverticuli present.  Then he had another CT scan in October 2020 that showed acute diverticulitis in the upper sigmoid colon that they felt were similar findings as to the previous CT scan in 2019 indicating possibly presumed recurrent diverticulitis.  Recent CBC, ferritin, TSH were normal (December 2024).  Was seen by [facility] back in October but they do not accept his insurance for procedure.  No abdominal pain and no family history of IBD or other GI issues.   Past Medical History:  Diagnosis Date   Asthma    Diverticulitis    Past Surgical History:  Procedure Laterality Date   TONSILLECTOMY      reports that he has never smoked. He has never used smokeless tobacco. He reports current drug use. Frequency: 7.00 times per week. Drug: Marijuana. He reports that he does not drink alcohol. family history includes Atrial fibrillation in his maternal grandfather; Colon polyps in his maternal  grandfather and maternal grandmother; Diabetes in his mother; Hypertension in his maternal grandfather; Lung cancer in his maternal great-grandfather. No Known Allergies    Outpatient Encounter Medications as of 10/03/2023  Medication Sig   fluticasone (FLONASE) 50 MCG/ACT nasal spray Place 2 sprays into both nostrils daily.   cetirizine (ZYRTEC) 10 MG tablet Take 10 mg by mouth daily as needed for allergies. (Patient not taking: Reported on 10/03/2023)   [DISCONTINUED] amoxicillin-clavulanate (AUGMENTIN) 875-125 MG tablet Take 1 tablet by mouth every 12 (twelve) hours.   [DISCONTINUED] azelastine (ASTELIN) 0.1 % nasal spray Place 2 sprays into both nostrils 2 (two) times daily. Use in each nostril as directed   No facility-administered encounter medications on file as of 10/03/2023.    REVIEW OF SYSTEMS  : All other systems reviewed and negative except where noted in the History of Present Illness.   PHYSICAL EXAM: BP 110/80 (Cuff Size: Normal)   Pulse 71   Ht 5\' 9"  (1.753 m)   Wt 236 lb 3.2 oz (107.1 kg)   BMI 34.88 kg/m  General: Well developed male in no acute distress Head: Normocephalic and atraumatic Eyes:  Sclerae anicteric, conjunctiva pink. Ears: Normal auditory acuity Lungs: Clear throughout to auscultation; no W/R/R. Heart: Regular rate and rhythm; no M/R/G. Abdomen: Soft, non-distended.  BS present.  Non-tender. Rectal:  Will be done at the time of colonoscopy. Musculoskeletal: Symmetrical with no gross deformities  Skin: No lesions on  visible extremities Extremities: No edema  Neurological: Alert oriented x 4, grossly non-focal Psychological:  Alert and cooperative. Normal mood and affect  ASSESSMENT AND PLAN: *28 year old male with complaints of rectal bleeding and change in bowel habits.  Reports forceful bowel movements and sounds like probably rectal spasms, no diarrhea and not hard stool.  Had a CT scan in 2019 that suggested possible colitis, another one in 2020  that showed inflammatory changes in the same area that they thought maybe was more of a diverticulitis.  Symptoms have been ongoing for years.  Will plan for colonoscopy to rule out IBD, etc.  This being scheduled Dr. Myrtie Neither.  The risks, benefits, and alternatives to colonoscopy were discussed with the patient and he consents to proceed.   CC:  Alicia Amel, MD

## 2023-10-23 ENCOUNTER — Encounter: Payer: Self-pay | Admitting: Gastroenterology

## 2023-10-28 ENCOUNTER — Encounter: Payer: Self-pay | Admitting: Certified Registered Nurse Anesthetist

## 2023-10-31 ENCOUNTER — Encounter: Payer: Self-pay | Admitting: Gastroenterology

## 2023-10-31 ENCOUNTER — Ambulatory Visit: Admitting: Gastroenterology

## 2023-10-31 VITALS — BP 127/87 | HR 64 | Temp 98.2°F | Resp 19 | Ht 69.0 in | Wt 236.0 lb

## 2023-10-31 DIAGNOSIS — K5730 Diverticulosis of large intestine without perforation or abscess without bleeding: Secondary | ICD-10-CM | POA: Diagnosis not present

## 2023-10-31 DIAGNOSIS — R197 Diarrhea, unspecified: Secondary | ICD-10-CM

## 2023-10-31 DIAGNOSIS — R194 Change in bowel habit: Secondary | ICD-10-CM

## 2023-10-31 DIAGNOSIS — K625 Hemorrhage of anus and rectum: Secondary | ICD-10-CM

## 2023-10-31 MED ORDER — SODIUM CHLORIDE 0.9 % IV SOLN: 500.0000 mL | Freq: Once | INTRAVENOUS | Status: DC

## 2023-10-31 MED ORDER — DICYCLOMINE HCL 10 MG PO CAPS: 10.0000 mg | ORAL_CAPSULE | Freq: Every day | ORAL | 1 refills | Status: AC | PRN

## 2023-10-31 NOTE — Progress Notes (Signed)
 Report given to PACU, vss

## 2023-10-31 NOTE — Patient Instructions (Signed)
 Please read handouts provided. Continue present medications. Use Bentyl ( dicyclomine ) 10 mg as needed for urgency or loose stool. Await pathology results. Consider FODMAP and lactose-free diet.   YOU HAD AN ENDOSCOPIC PROCEDURE TODAY AT THE [facility]:   Refer to the procedure report that was given to you for any specific questions about what was found during the examination.  If the procedure report does not answer your questions, please call your gastroenterologist to clarify.  If you requested that your care partner not be given the details of your procedure findings, then the procedure report has been included in a sealed envelope for you to review at your convenience later.  YOU SHOULD EXPECT: Some feelings of bloating in the abdomen. Passage of more gas than usual.  Walking can help get rid of the air that was put into your GI tract during the procedure and reduce the bloating. If you had a lower endoscopy (such as a colonoscopy or flexible sigmoidoscopy) you may notice spotting of blood in your stool or on the toilet paper. If you underwent a bowel prep for your procedure, you may not have a normal bowel movement for a few days.  Please Note:  You might notice some irritation and congestion in your nose or some drainage.  This is from the oxygen used during your procedure.  There is no need for concern and it should clear up in a day or so.  SYMPTOMS TO REPORT IMMEDIATELY:  Following lower endoscopy (colonoscopy or flexible sigmoidoscopy):  Excessive amounts of blood in the stool  Significant tenderness or worsening of abdominal pains  Swelling of the abdomen that is new, acute  Fever of 100F or higher.  For urgent or emergent issues, a gastroenterologist can be reached at any hour by calling [phone]. Do not use MyChart messaging for urgent concerns.    DIET:  We do recommend a small meal at first, but then you may proceed to your regular diet.  Drink plenty of  fluids but you should avoid alcoholic beverages for 24 hours.  ACTIVITY:  You should plan to take it easy for the rest of today and you should NOT DRIVE or use heavy machinery until tomorrow (because of the sedation medicines used during the test).    FOLLOW UP: Our staff will call the number listed on your records the next business day following your procedure.  We will call around 7:15- 8:00 am to check on you and address any questions or concerns that you may have regarding the information given to you following your procedure. If we do not reach you, we will leave a message.     If any biopsies were taken you will be contacted by phone or by letter within the next 1-3 weeks.  Please call us  at [phone] if you have not heard about the biopsies in 3 weeks.    SIGNATURES/CONFIDENTIALITY: You and/or your care partner have signed paperwork which will be entered into your electronic medical record.  These signatures attest to the fact that that the information above on your After Visit Summary has been reviewed and is understood.  Full responsibility of the confidentiality of this discharge information lies with you and/or your care-partner.

## 2023-10-31 NOTE — Op Note (Signed)
 [facility] Patient Name: Andr… Brading Procedure Date: 10/31/2023 10:20 AM MRN: [id number] Endoscopist: Ace Abu L. Dominic Friendly , MD, [id number] Age: 28 Referring MD:  Date of Birth: 1995/12/28 Gender: Male Account #: [id number] Procedure:                Colonoscopy Indications:              Rectal bleeding (intermittent, years), Chronic                            (intermittent) diarrhea, also has episodes of                            urgency +/- loose stool                           Prior episodes of diverticulitis Medicines:                Monitored Anesthesia Care Procedure:                Pre-Anesthesia Assessment:                           - Prior to the procedure, a History and Physical                            was performed, and patient medications and                            allergies were reviewed. The patient's tolerance of                            previous anesthesia was also reviewed. The risks                            and benefits of the procedure and the sedation                            options and risks were discussed with the patient.                            All questions were answered, and informed consent                            was obtained. Prior Anticoagulants: The patient has                            taken no anticoagulant or antiplatelet agents. ASA                            Grade Assessment: II - A patient with mild systemic                            disease. After reviewing the risks and benefits,  the patient was deemed in satisfactory condition to                            undergo the procedure.                           After obtaining informed consent, the colonoscope                            was passed under direct vision. Throughout the                            procedure, the patient's blood pressure, pulse, and                            oxygen saturations were monitored continuously. The                             CF [id number] #[id number] was introduced through the anus                            and advanced to the the terminal ileum, with                            identification of the appendiceal orifice and IC                            valve. The colonoscopy was performed without                            difficulty. The patient tolerated the procedure                            well. The quality of the bowel preparation was                            good. The terminal ileum, ileocecal valve,                            appendiceal orifice, and rectum were photographed. Scope In: 10:34:10 AM Scope Out: 10:47:04 AM Scope Withdrawal Time: 0 hours 10 minutes 29 seconds  Total Procedure Duration: 0 hours 12 minutes 54 seconds  Findings:                 The perianal and digital rectal examinations were                            normal.                           The terminal ileum appeared normal.                           Normal mucosa was found in the entire colon.  Biopsies for histology were taken with a cold                            forceps from the right colon and left colon for                            evaluation of microscopic colitis.                           Diverticula were found in the sigmoid colon.                            Associated prominent mucosal folds                           The exam was otherwise without abnormality on                            direct and retroflexion views. No hemorrhoids seen,                            no fissure. Complications:            No immediate complications. Estimated Blood Loss:     Estimated blood loss was minimal. Impression:               - The examined portion of the ileum was normal.                           - Normal mucosa in the entire examined colon.                            Biopsied.                           - Diverticulosis in the sigmoid colon.                           - The  examination was otherwise normal on direct                            and retroflexion views.                           Overall clinical picture suggests IBS with episodic                            benign anal bleeding. Recommendation:           - Patient has a contact number available for                            emergencies. The signs and symptoms of potential                            delayed complications were discussed with the  patient. Return to normal activities tomorrow.                            Written discharge instructions were provided to the                            patient.                           - Resume previous diet. Consider trial of FODMAP                            and lactose-free diet.                           - Continue present medications.                           - Use Bentyl (dicyclomine) 10 mg PO As needed for                            urgency or loose stool. Dispense 60, refill 1                           - Await pathology results.                           - No recommendation at this time regarding repeat                            colonoscopy due to young age.                           - Follow-up with APP will be arranged after biopsy                            results. Celiac antibody testing at office                            follow-up if not previously done. Frandy Basnett L. Dominic Friendly, MD 10/31/2023 10:54:22 AM This report has been signed electronically.

## 2023-10-31 NOTE — Progress Notes (Signed)
 Pt's states no medical or surgical changes since previsit or office visit.

## 2023-10-31 NOTE — Progress Notes (Signed)
 Called to room to assist during endoscopic procedure.  Patient ID and intended procedure confirmed with present staff. Received instructions for my participation in the procedure from the performing physician.

## 2023-10-31 NOTE — Progress Notes (Signed)
 No significant changes to clinical history since GI office visit on 10/03/23.  The patient is appropriate for an endoscopic procedure in the ambulatory setting.  - Lorella Roles, MD

## 2023-11-01 ENCOUNTER — Telehealth: Payer: Self-pay

## 2023-11-01 NOTE — Telephone Encounter (Signed)
  Follow up Call-     10/31/2023    9:20 AM  Call back number  Post procedure Call Back phone  # [phone]  Permission to leave phone message Yes     Patient questions:  Do you have a fever, pain , or abdominal swelling? No. Pain Score  0 *  Have you tolerated food without any problems? Yes.    Have you been able to return to your normal activities? Yes.    Do you have any questions about your discharge instructions: Diet   No. Medications  No. Follow up visit  No.  Do you have questions or concerns about your Care? No.  Actions: * If pain score is 4 or above: No action needed, pain <4.

## 2023-11-02 ENCOUNTER — Encounter: Payer: Self-pay | Admitting: Gastroenterology

## 2023-11-02 LAB — SURGICAL PATHOLOGY

## 2023-11-03 ENCOUNTER — Encounter: Payer: Self-pay | Admitting: Gastroenterology

## 2023-11-11 ENCOUNTER — Encounter: Payer: Self-pay | Admitting: Student

## 2023-12-11 ENCOUNTER — Encounter: Payer: Self-pay | Admitting: *Deleted

## 2024-01-09 ENCOUNTER — Encounter: Payer: Self-pay | Admitting: Internal Medicine

## 2024-01-09 ENCOUNTER — Ambulatory Visit: Admitting: Internal Medicine

## 2024-01-09 VITALS — BP 120/84 | HR 78 | Temp 98.1°F | Ht 69.0 in | Wt 230.6 lb

## 2024-01-09 DIAGNOSIS — K5730 Diverticulosis of large intestine without perforation or abscess without bleeding: Secondary | ICD-10-CM

## 2024-01-09 DIAGNOSIS — E739 Lactose intolerance, unspecified: Secondary | ICD-10-CM | POA: Diagnosis not present

## 2024-01-09 DIAGNOSIS — K5792 Diverticulitis of intestine, part unspecified, without perforation or abscess without bleeding: Secondary | ICD-10-CM | POA: Insufficient documentation

## 2024-01-09 DIAGNOSIS — K5900 Constipation, unspecified: Secondary | ICD-10-CM

## 2024-01-09 DIAGNOSIS — R933 Abnormal findings on diagnostic imaging of other parts of digestive tract: Secondary | ICD-10-CM | POA: Insufficient documentation

## 2024-01-09 NOTE — Progress Notes (Signed)
 [facility] PRIMARY CARE [facility] [address] [place] [address] Dept: [phone] Dept Fax: [phone]  New Patient Office Visit  Subjective:   Eugene Wolfe 1996/01/03 01/09/2024  Chief Complaint  Patient presents with   Establish Care    Discuss gastro issues     HPI: Eugene Wolfe presents today to establish care at [facility] at [facility]. Introduced to Publishing rights manager role and practice setting.  All questions answered.  Concerns: See below   Discussed the use of AI scribe software for clinical note transcription with the patient, who gave verbal consent to proceed.  History of Present Illness   Eugene Wolfe is a 28 year old male with history of diverticulosis who presents with gastrointestinal issues.  He experiences gastrointestinal issues, including constipation and frequent urges to defecate without significant results. He often sits for extended periods without bowel movements, passing only gas. This occurs daily, though he is not in pain.  A colonoscopy in April revealed diverticulosis. He has a history of diverticulitis.  No recent rectal bleeding or abdominal pain.  He manages symptoms by adjusting his diet, increasing fiber intake, and avoiding trigger foods like dairy due to history of lactose intolerance. He uses prebiotic drinks and previously used MiraLAX daily for constipation.   He has Bentyl  PRN abdominal cramping, but has not needed to use it.   He recently quit smoking THC vapes and experiences some coughing. He is motivated to improve his health due to presence of his young daughter, who is about to turn one year old.       The following portions of the patient's history were reviewed and updated as appropriate: past medical history, past surgical history, family history, social history, allergies, medications, and problem list.   Patient Active Problem List   Diagnosis Date Noted    Diverticulitis 01/09/2024   Abnormal findings on radiological examination of gastrointestinal tract 01/09/2024   Lactose intolerance 01/09/2024   Diverticulosis of colon 01/09/2024   Rectal bleeding 10/03/2023   Constipation 10/03/2023   Bilateral carpal tunnel syndrome 06/15/2023   Hematochezia 06/15/2023   Allergies 04/28/2023   Past Medical History:  Diagnosis Date   Asthma    Diverticulitis    Past Surgical History:  Procedure Laterality Date   TONSILLECTOMY     Family History  Problem Relation Age of Onset   Diabetes Mother    Colon polyps Maternal Grandmother    Colon polyps Maternal Grandfather    Hypertension Maternal Grandfather    Atrial fibrillation Maternal Grandfather    Lung cancer Maternal Great-grandfather    Colon cancer Neg Hx    Esophageal cancer Neg Hx    Rectal cancer Neg Hx    Stomach cancer Neg Hx     Current Outpatient Medications:    cetirizine (ZYRTEC) 10 MG tablet, Take 10 mg by mouth daily as needed for allergies., Disp: , Rfl:    dicyclomine  (BENTYL ) 10 MG capsule, Take 1 capsule (10 mg total) by mouth daily as needed for spasms. As needed for urgency or loose stools., Disp: 60 capsule, Rfl: 1   fluticasone  (FLONASE ) 50 MCG/ACT nasal spray, Place 2 sprays into both nostrils daily., Disp: 16 g, Rfl: 6 No Known Allergies  ROS: A complete ROS was performed with pertinent positives/negatives noted in the HPI. The remainder of the ROS are negative.   Objective:   Today's Vitals   01/09/24 1439  BP: 120/84  Pulse: 78  Temp: 98.1 F (  36.7 C)  TempSrc: Temporal  SpO2: 98%  Weight: 230 lb 9.6 oz (104.6 kg)  Height: 5' 9 (1.753 m)    GENERAL: Well-appearing, in NAD. Well nourished.  SKIN: Pink, warm and dry. No rash, lesion, ulceration, or ecchymoses.  NECK: Trachea midline. Full ROM w/o pain or tenderness. No lymphadenopathy.  RESPIRATORY: Chest wall symmetrical. Respirations even and non-labored. Breath sounds clear to auscultation  bilaterally.  CARDIAC: S1, S2 present, regular rate and rhythm. Peripheral pulses 2+ bilaterally.  GI: Abdomen soft, non-tender. Normoactive bowel sounds. No rebound tenderness. No hepatomegaly or splenomegaly. No CVA tenderness.  EXTREMITIES: Without clubbing, cyanosis, or edema.  NEUROLOGIC: No motor or sensory deficits. Steady, even gait.  PSYCH/MENTAL STATUS: Alert, oriented x 3. Cooperative, appropriate mood and affect.   Health Maintenance Due  Topic Date Due   Hepatitis C Screening  Never done    No results found for any visits on 01/09/24.  Assessment & Plan:  Assessment and Plan    Diverticulosis Diverticulosis without acute diverticulitis. No abdominal pain or rectal bleeding, but frequent urges to defecate and occasional constipation reported. - Advise high-fiber diet to prevent constipation. - Avoid trigger foods such as nuts and seeds. - Use Bentyl  as needed for abdominal cramping.  Constipation Constipation with infrequent bowel movements and difficulty passing stool. MiraLAX used daily, potentially affecting bowel habits. Increasing dietary fiber and hydration recommended. - Advise high-fiber diet. - Advise increased water intake. - Recommend Metamucil once daily. - Use MiraLAX as needed.  Lactose Intolerance Lactose intolerance contributing to gastrointestinal symptoms. Advised to avoid dairy products and consider lactase supplements if necessary. Exploring dairy-free alternatives and prebiotic drinks. - Advise avoidance of dairy products. - Continue prebiotic intake.      No orders of the defined types were placed in this encounter.  No orders of the defined types were placed in this encounter.   Return in about 3 months (around 04/10/2024) for Annual Physical Exam with fasting lab work.   Rosina Senters, FNP

## 2024-01-09 NOTE — Patient Instructions (Signed)
 Metamucil wafers or drink - can get at any pharmacy or [facility] over the counter . Take this once a daily    Continue pre-biotic  Avoid trigger foods  Drink plenty of water

## 2024-01-13 ENCOUNTER — Encounter: Payer: Self-pay | Admitting: Internal Medicine

## 2024-01-23 ENCOUNTER — Encounter: Payer: Self-pay | Admitting: Internal Medicine

## 2024-01-24 ENCOUNTER — Ambulatory Visit: Payer: Self-pay

## 2024-01-24 NOTE — Telephone Encounter (Signed)
 FYI Only or Action Required?: FYI only for provider.  Patient was last seen in primary care on 01/09/2024 by Billy Knee, FNP.  Called Nurse Triage reporting Constipation.  Symptoms began a week ago.  Interventions attempted: OTC medications: Metamucil gummies.  Symptoms are: LLQ intermittent mild abdominal pain relieved by passing gas, bloating, constipation/hard stools stable.  Triage Disposition: See PCP Within 2 Weeks  Patient/caregiver understands and will follow disposition?: Yes                Copied from CRM [id number]. Topic: Clinical - Red Word Triage >> Jan 24, 2024 11:58 AM [place] J wrote: [facility] that prompted transfer to Nurse Triage: Pain in patinet's lower colon. Reason for Disposition  [1] Constipation persists > 1 week AND [2] no improvement after using Care Advice  Answer Assessment - Initial Assessment Questions 1. STOOL PATTERN OR FREQUENCY: How often do you have a bowel movement (BM)?  (Normal range: 3 times a day to every 3 days)  When was your last BM?       Normal is 3 times daily, lately he states he feels the urge to go but not much happens. Last BM this morning.  2. STRAINING: Do you have to strain to have a BM?      Yes.  3. ONSET: When did the constipation begin?     X 1 week.  4. RECTAL PAIN: Does your rectum hurt when the stool comes out? If Yes, ask: Do you have hemorrhoids? How bad is the pain?  (Scale 1-10; or mild, moderate, severe)     No.  5. BM COMPOSITION: Are the stools hard?      Hard stools.  6. BLOOD ON STOOLS: Has there been any blood on the toilet tissue or on the surface of the BM? If Yes, ask: When was the last time?     No.  7. CHRONIC CONSTIPATION: Is this a new problem for you?  If No, ask: How long have you had this problem? (days, weeks, months)      No, he states back in 2017 or 2019 he went to urgent care for a bacterial infection of the colon.  8. CHANGES IN DIET OR HYDRATION: Have  there been any recent changes in your diet? How much fluids are you drinking on a daily basis?  How much have you had to drink today?     He states he tries to drink enough water; daily he drinks about 5-6 15 fl oz water bottles daily. No major changes to diet recently. He states he has had more red meat but stopped since his symptoms started.  9. MEDICINES: Have you been taking any new medicines? Are you taking any narcotic pain medicines? (e.g., Dilaudid, morphine , Percocet, Vicodin)     He states the symptoms started since adding Metamucil gummies.  10. LAXATIVES: Have you been using any stool softeners, laxatives, or enemas?  If Yes, ask What are you using, how often, and when was the last time?       No.  11. ACTIVITY:  How much walking do you do every day?  Has your activity level decreased in the past week?        He states he is active, 15,000 steps daily.  12. CAUSE: What do you think is causing the constipation?        IBS.  13. MEDICAL HISTORY: Do you have a history of hemorrhoids, rectal fissures, rectal surgery, or rectal abscess?  No.  14. OTHER SYMPTOMS: Do you have any other symptoms? (e.g., abdomen pain, bloating, fever, vomiting)       LLQ abdominal pain intermittent 2/10 at this time relieved by passing gas, bloating. Patient denies nausea, vomiting, diarrhea, fever.  15. PREGNANCY: Is there any chance you are pregnant? When was your last menstrual period?       N/A.  Protocols used: Constipation-A-AH

## 2024-01-28 ENCOUNTER — Ambulatory Visit: Admitting: Internal Medicine

## 2024-01-28 VITALS — BP 106/70 | HR 64 | Temp 98.9°F | Ht 69.0 in | Wt 235.6 lb

## 2024-01-28 DIAGNOSIS — R1032 Left lower quadrant pain: Secondary | ICD-10-CM

## 2024-01-28 NOTE — Patient Instructions (Signed)
 Bentyl  as needed  Continue miralax as needed for constipation  Keep well hydrated with water  Can use Metamucil as needed for constipation  Avoid trigger foods to prevent diverticulitis flare ups.

## 2024-01-28 NOTE — Progress Notes (Signed)
 [facility] PRIMARY CARE [facility] [address] [place] [address] Dept: [phone] Dept Fax: [phone]  Acute Care Office Visit  Subjective:   Eugene Wolfe 03/12/1996 01/28/2024  Chief Complaint  Patient presents with   Flank Pain    Left side believes its his colon, started a week ago    HPI:  Discussed the use of AI scribe software for clinical note transcription with the patient, who gave verbal consent to proceed.  History of Present Illness   Eugene Wolfe is a 28 year old male with diverticulitis who presents with left lower abdominal pain.  He has been experiencing left lower abdominal pain for approximately two weeks, which began after starting Metamucil gummies. The pain is cramp-like, occurs with gas passage, and is relieved after passing gas or having a bowel movement. Initially intense, the pain has since resolved, with the last episode occurring on Friday or Saturday.  He reports difficulty passing stool, feeling strained, but is able to have bowel movements, albeit small in quantity. His last bowel movement was this morning, described as normal and well-formed. No blood in the stool is noted.  He experienced mild nausea at the onset of symptoms but was able to continue working. No vomiting, fever, or pain with urination is reported. He has a history of diverticulitis and has been cautious about avoiding trigger foods, although he has consumed more red meat recently.  He has been using dicyclomine  (Bentyl ) at night for cramping, and he did not take it last night due to fatigue. He also uses Miralax as needed for constipation and ensures adequate hydration.       The following portions of the patient's history were reviewed and updated as appropriate: past medical history, past surgical history, family history, social history, allergies, medications, and problem list.   Patient Active Problem List   Diagnosis Date Noted    Diverticulitis 01/09/2024   Abnormal findings on radiological examination of gastrointestinal tract 01/09/2024   Lactose intolerance 01/09/2024   Diverticulosis of colon 01/09/2024   Rectal bleeding 10/03/2023   Constipation 10/03/2023   Bilateral carpal tunnel syndrome 06/15/2023   Hematochezia 06/15/2023   Allergies 04/28/2023   Past Medical History:  Diagnosis Date   Asthma    Diverticulitis    Past Surgical History:  Procedure Laterality Date   TONSILLECTOMY     Family History  Problem Relation Age of Onset   Diabetes Mother    Colon polyps Maternal Grandmother    Colon polyps Maternal Grandfather    Hypertension Maternal Grandfather    Atrial fibrillation Maternal Grandfather    Lung cancer Maternal Great-grandfather    Colon cancer Neg Hx    Esophageal cancer Neg Hx    Rectal cancer Neg Hx    Stomach cancer Neg Hx     Current Outpatient Medications:    cetirizine (ZYRTEC) 10 MG tablet, Take 10 mg by mouth daily as needed for allergies., Disp: , Rfl:    dicyclomine  (BENTYL ) 10 MG capsule, Take 1 capsule (10 mg total) by mouth daily as needed for spasms. As needed for urgency or loose stools., Disp: 60 capsule, Rfl: 1   fluticasone  (FLONASE ) 50 MCG/ACT nasal spray, Place 2 sprays into both nostrils daily., Disp: 16 g, Rfl: 6 No Known Allergies   ROS: A complete ROS was performed with pertinent positives/negatives noted in the HPI. The remainder of the ROS are negative.    Objective:   Today's Vitals   01/28/24 1307  BP: 106/70  Pulse: 64  Temp: 98.9 F (37.2 C)  TempSrc: Temporal  SpO2: 98%  Weight: 235 lb 9.6 oz (106.9 kg)  Height: 5' 9 (1.753 m)    GENERAL: Well-appearing, in NAD. Well nourished.  SKIN: Pink, warm and dry. No rash, lesion, ulceration, or ecchymoses.  NECK: Trachea midline. Full ROM w/o pain or tenderness. No lymphadenopathy.  RESPIRATORY: Chest wall symmetrical. Respirations even and non-labored. Breath sounds clear to auscultation  bilaterally.  CARDIAC: S1, S2 present, regular rate and rhythm. Peripheral pulses 2+ bilaterally.  GI: Abdomen soft, non-tender. Normoactive bowel sounds. No rebound tenderness. No hepatomegaly or splenomegaly. No CVA tenderness.  EXTREMITIES: Without clubbing, cyanosis, or edema.  PSYCH/MENTAL STATUS: Alert, oriented x 3. Cooperative, appropriate mood and affect.    No results found for any visits on 01/28/24.    Assessment & Plan:  Assessment and Plan    Left lower abdominal pain Pain resolved. Likely due to increased gas from Metamucil or mild constipation. No diverticulitis signs. - Continue Bentyl  as needed for cramping. - Continue Miralax as needed for constipation. - Maintain hydration. - Use Metamucil as needed; discontinue if gas and pain occur. - Avoid trigger foods.   No orders of the defined types were placed in this encounter.  No orders of the defined types were placed in this encounter.  Lab Orders  No laboratory test(s) ordered today   No images are attached to the encounter or orders placed in the encounter.  Return for Scheduled Routine Office Visits and as needed.   Rosina Senters, FNP

## 2024-04-09 ENCOUNTER — Encounter: Payer: Self-pay | Admitting: Internal Medicine

## 2024-04-09 ENCOUNTER — Ambulatory Visit (INDEPENDENT_AMBULATORY_CARE_PROVIDER_SITE_OTHER): Admitting: Internal Medicine

## 2024-04-09 VITALS — BP 138/82 | HR 73 | Temp 98.0°F | Ht 69.0 in | Wt 241.2 lb

## 2024-04-09 DIAGNOSIS — G8929 Other chronic pain: Secondary | ICD-10-CM | POA: Diagnosis not present

## 2024-04-09 DIAGNOSIS — M25562 Pain in left knee: Secondary | ICD-10-CM

## 2024-04-09 DIAGNOSIS — Z0000 Encounter for general adult medical examination without abnormal findings: Secondary | ICD-10-CM | POA: Diagnosis not present

## 2024-04-09 DIAGNOSIS — M79672 Pain in left foot: Secondary | ICD-10-CM

## 2024-04-09 MED ORDER — DICLOFENAC SODIUM 1 % EX GEL: 2.0000 g | Freq: Four times a day (QID) | CUTANEOUS | 2 refills | Status: AC | PRN

## 2024-04-09 NOTE — Progress Notes (Signed)
 Subjective:   Eugene Wolfe 1996-04-29 04/09/2024  CC: Chief Complaint  Patient presents with   Annual Exam    HPI: Eugene Wolfe is a 28 y.o. male who presents for a routine health maintenance exam.  Labs collected at time of visit.   Patient complains of left knee pain ongoing for 2-3 months. No known injuries. Reports stiffness if sitting for long periods of time. Improves always with movement.   Also complains of intermittent left heel pain ongoing for the past couple weeks. Is on his feet constantly at work. No known recent injuries.   HEALTH SCREENINGS: - PSA (50+): Not applicable  No results found for: PSA1, PSA   - Colonoscopy (45+): recently done this year - AAA Screening: Not applicable  Men age 38-75 who have ever smoked - Lung Cancer screening with low-dose CT: Not applicable Adults age 16-80 who are current cigarette smokers or quit within the last 15 years. Must have 20 pack year history.   IMMUNIZATIONS:  - Tdap: Tetanus vaccination status reviewed: declined. - Influenza: declined   Past medical history, surgical history, medications, allergies, family history and social history reviewed with patient today and changes made to appropriate areas of the chart.   Social History   Socioeconomic History   Marital status: Single    Spouse name: Not on file   Number of children: 1   Years of education: Not on file   Highest education level: 12th grade  Occupational History   Not on file  Tobacco Use   Smoking status: Never   Smokeless tobacco: Never  Vaping Use   Vaping status: Never Used  Substance and Sexual Activity   Alcohol use: No   Drug use: Not Currently    Types: Marijuana   Sexual activity: Yes    Birth control/protection: Condom  Other Topics Concern   Not on file  Social History Narrative   Not on file   Social Drivers of Health   Financial Resource Strain: Medium Risk (01/28/2024)   Overall Financial Resource Strain (CARDIA)     Difficulty of Paying Living Expenses: Somewhat hard  Food Insecurity: Food Insecurity Present (01/28/2024)   Hunger Vital Sign    Worried About Running Out of Food in the Last Year: Sometimes true    Ran Out of Food in the Last Year: Patient declined  Transportation Needs: No Transportation Needs (01/28/2024)   PRAPARE - Administrator, Civil Service (Medical): No    Lack of Transportation (Non-Medical): No  Physical Activity: Sufficiently Active (01/28/2024)   Exercise Vital Sign    Days of Exercise per Week: 5 days    Minutes of Exercise per Session: 150+ min  Stress: No Stress Concern Present (01/28/2024)   [employer] of Occupational Health - Occupational Stress Questionnaire    Feeling of Stress: Only a little  Social Connections: Moderately Isolated (01/28/2024)   Social Connection and Isolation Panel    Frequency of Communication with Friends and Family: More than three times a week    Frequency of Social Gatherings with Friends and Family: More than three times a week    Attends Religious Services: Never    Database administrator or Organizations: No    Attends Engineer, structural: Not on file    Marital Status: Living with partner  Intimate Partner Violence: Not on file    Past Medical History:  Diagnosis Date   Asthma    Diverticulitis  Past Surgical History:  Procedure Laterality Date   TONSILLECTOMY      Current Outpatient Medications on File Prior to Visit  Medication Sig   cetirizine (ZYRTEC) 10 MG tablet Take 10 mg by mouth daily as needed for allergies.   dicyclomine  (BENTYL ) 10 MG capsule Take 1 capsule (10 mg total) by mouth daily as needed for spasms. As needed for urgency or loose stools.   fluticasone  (FLONASE ) 50 MCG/ACT nasal spray Place 2 sprays into both nostrils daily.   No current facility-administered medications on file prior to visit.    No Known Allergies  Family History  Problem Relation Age of Onset    Diabetes Mother    Colon polyps Maternal Grandmother    Colon polyps Maternal Grandfather    Hypertension Maternal Grandfather    Atrial fibrillation Maternal Grandfather    Lung cancer Maternal Great-grandfather    Colon cancer Neg Hx    Esophageal cancer Neg Hx    Rectal cancer Neg Hx    Stomach cancer Neg Hx      ROS: Denies fever, fatigue, unexplained weight loss/gain, hearing or vision changes, cardiac or respiratory complaints. Denies neurological deficits, gastrointestinal or genitourinary complaints, mental health complaints, and skin changes.   Objective:   Today's Vitals   04/09/24 1317  BP: 138/82  Pulse: 73  Temp: 98 F (36.7 C)  TempSrc: Oral  SpO2: 97%  Weight: 241 lb 3.2 oz (109.4 kg)  Height: 5' 9 (1.753 m)    GENERAL APPEARANCE: Well-appearing, in NAD. Well nourished.  SKIN: Pink, warm and dry. Turgor normal. No rash, lesion, ulceration, or ecchymoses. Hair evenly distributed.  HEENT: HEAD: Normocephalic.  EYES: PERRLA. EOMI. Lids intact w/o defect. Sclera white, Conjunctiva pink w/o exudate.  EARS: External ear w/o redness, swelling, masses or lesions. EAC clear. TM's intact, translucent w/o bulging, appropriate landmarks visualized. Appropriate acuity to conversational tones.  NOSE: Septum midline w/o deformity. Nares patent, mucosa pink and non-inflamed w/o drainage. No sinus tenderness.  THROAT: Uvula midline. Oropharynx clear. Tonsils absent. Oral mucosa pink and moist.  NECK: Supple, Trachea midline. Full ROM w/o pain or tenderness. No lymphadenopathy. Thyroid  non-tender w/o enlargement or palpable masses.  RESPIRATORY: Chest wall symmetrical w/o masses. Respirations even and non-labored. Breath sounds clear to auscultation bilaterally. No wheezes, rales, rhonchi, or crackles. CARDIAC: S1, S2 present, regular rate and rhythm. No gallops, murmurs, rubs, or clicks. No carotid bruits. Capillary refill <2 seconds. Peripheral pulses 2+ bilaterally. GI:  Abdomen soft w/o distention. Normoactive bowel sounds. No palpable masses or tenderness. No guarding or rebound tenderness. Liver and spleen w/o tenderness or enlargement. No CVA tenderness.  GU:  deferred exam. MSK: Muscle tone and strength appropriate for age, w/o atrophy or abnormal movement. EXTREMITIES: Active ROM intact, w/o tenderness, crepitus, or contracture. No obvious joint deformities or effusions. No clubbing, edema, or cyanosis.  NEUROLOGIC: CN's II-XII intact. Motor strength symmetrical with no obvious weakness. No sensory deficits. DTR 2+ symmetric bilaterally. Steady, even gait.  PSYCH/MENTAL STATUS: Alert, oriented x 3. Cooperative, appropriate mood and affect.    Depression and Anxiety Screen done today and results listed below:     04/09/2024    1:16 PM 01/28/2024    1:05 PM 01/09/2024    2:43 PM 04/27/2023    4:34 PM  Depression screen PHQ 2/9  Decreased Interest 0 0 0 0  Down, Depressed, Hopeless 0 0 1 0  PHQ - 2 Score 0 0 1 0  Altered sleeping   0 1  Tired, decreased energy   1 2  Change in appetite   0 0  Feeling bad or failure about yourself    0 0  Trouble concentrating   0 0  Moving slowly or fidgety/restless   0 0  Suicidal thoughts   0 0  PHQ-9 Score   2 3  Difficult doing work/chores   Not difficult at all Not difficult at all      04/09/2024    1:16 PM 01/09/2024    2:43 PM  GAD 7 : Generalized Anxiety Score  Nervous, Anxious, on Edge 0 1  Control/stop worrying 0 0  Worry too much - different things 0 0  Trouble relaxing 0 0  Restless 0 0  Easily annoyed or irritable 0 0  Afraid - awful might happen 0 0  Total GAD 7 Score 0 1  Anxiety Difficulty Not difficult at all Not difficult at all    Assessment & Plan:  Annual physical exam -     CBC with Differential/Platelet; Future -     Comprehensive metabolic panel with GFR; Future -     Lipid panel; Future -     TSH; Future  Chronic pain of left knee -     Diclofenac Sodium; Apply 2 g topically  4 (four) times daily as needed (knee pain).  Dispense: 150 g; Refill: 2 - keep active  - can use heating pad as needed  Pain of left heel - rolling foot on frozen water bottle  - heel inserts in shoes for support  - heel stretches with exercise band    Orders Placed This Encounter  Procedures   CBC with Differential/Platelet    Standing Status:   Future    Expiration Date:   10/08/2024   Comprehensive metabolic panel with GFR    Standing Status:   Future    Expiration Date:   10/08/2024   Lipid panel    Standing Status:   Future    Expiration Date:   10/08/2024   TSH    Standing Status:   Future    Expiration Date:   10/08/2024    PATIENT COUNSELING: - Encouraged to adjust caloric intake to maintain or achieve ideal body weight, to reduce intake of dietary saturated fat and total fat, to limit sodium intake by avoiding high sodium foods and not adding table salt, and to maintain adequate dietary potassium and calcium preferably from fresh fruits, vegetables, and low-fat dairy products.    - Stressed the importance of regular exercise - Injury prevention: Discussed safety belts  NEXT PREVENTATIVE PHYSICAL DUE IN 1 YEAR.  Return in about 1 year (around 04/09/2025) for Annual Physical Exam with fasting lab work and as needed.  Rosina Senters, FNP

## 2024-04-09 NOTE — Patient Instructions (Signed)
 Knee pain:  Heating pad  Keep active  Voltaren Gel - can use up to 4 times a day as needed for pain.    Heel pain:  Roll foot on frozen water bottle Heel inserts  Heel stretches with exercise band.

## 2024-04-16 ENCOUNTER — Other Ambulatory Visit

## 2025-04-13 ENCOUNTER — Encounter: Admitting: Internal Medicine
